# Patient Record
Sex: Male | Born: 1935
Health system: Southern US, Community
[De-identification: ages and names within clinical notes are randomized; demographics above are authoritative.]

## PROBLEM LIST (undated history)

## (undated) DIAGNOSIS — I4891 Unspecified atrial fibrillation: Secondary | ICD-10-CM

## (undated) DIAGNOSIS — K219 Gastro-esophageal reflux disease without esophagitis: Secondary | ICD-10-CM

## (undated) DIAGNOSIS — I10 Essential (primary) hypertension: Secondary | ICD-10-CM

## (undated) DIAGNOSIS — J45909 Unspecified asthma, uncomplicated: Secondary | ICD-10-CM

## (undated) DIAGNOSIS — N289 Disorder of kidney and ureter, unspecified: Secondary | ICD-10-CM

## (undated) DIAGNOSIS — Z961 Presence of intraocular lens: Secondary | ICD-10-CM

## (undated) DIAGNOSIS — C801 Malignant (primary) neoplasm, unspecified: Secondary | ICD-10-CM

## (undated) DIAGNOSIS — N400 Enlarged prostate without lower urinary tract symptoms: Secondary | ICD-10-CM

## (undated) DIAGNOSIS — M549 Dorsalgia, unspecified: Secondary | ICD-10-CM

## (undated) DIAGNOSIS — G629 Polyneuropathy, unspecified: Secondary | ICD-10-CM

## (undated) DIAGNOSIS — L570 Actinic keratosis: Secondary | ICD-10-CM

## (undated) DIAGNOSIS — R911 Solitary pulmonary nodule: Secondary | ICD-10-CM

## (undated) DIAGNOSIS — M542 Cervicalgia: Secondary | ICD-10-CM

## (undated) DIAGNOSIS — I482 Chronic atrial fibrillation, unspecified: Secondary | ICD-10-CM

## (undated) DIAGNOSIS — M1990 Unspecified osteoarthritis, unspecified site: Secondary | ICD-10-CM

## (undated) HISTORY — PX: BAND HEMORRHOIDECTOMY: SHX1213

## (undated) HISTORY — PX: CARDIAC CATHETERIZATION: SHX172

## (undated) HISTORY — PX: HEMORRHOID SURGERY: SHX153

---

## 2008-08-03 ENCOUNTER — Encounter: Admission: RE | Admit: 2008-08-03 | Discharge: 2008-08-03 | Payer: Self-pay | Admitting: Otolaryngology

## 2010-02-18 ENCOUNTER — Encounter: Payer: Self-pay | Admitting: Otolaryngology

## 2014-07-14 ENCOUNTER — Emergency Department (HOSPITAL_BASED_OUTPATIENT_CLINIC_OR_DEPARTMENT_OTHER)
Admission: EM | Admit: 2014-07-14 | Discharge: 2014-07-14 | Disposition: A | Discharge status: Home / Self Care | Payer: Medicare Other | Attending: Emergency Medicine | Admitting: Emergency Medicine

## 2014-07-14 ENCOUNTER — Encounter (HOSPITAL_BASED_OUTPATIENT_CLINIC_OR_DEPARTMENT_OTHER): Payer: Self-pay | Admitting: *Deleted

## 2014-07-14 DIAGNOSIS — Y998 Other external cause status: Secondary | ICD-10-CM | POA: Diagnosis not present

## 2014-07-14 DIAGNOSIS — Y9389 Activity, other specified: Secondary | ICD-10-CM | POA: Diagnosis not present

## 2014-07-14 DIAGNOSIS — I4891 Unspecified atrial fibrillation: Secondary | ICD-10-CM | POA: Diagnosis not present

## 2014-07-14 DIAGNOSIS — S30860A Insect bite (nonvenomous) of lower back and pelvis, initial encounter: Secondary | ICD-10-CM | POA: Insufficient documentation

## 2014-07-14 DIAGNOSIS — Y9289 Other specified places as the place of occurrence of the external cause: Secondary | ICD-10-CM | POA: Diagnosis not present

## 2014-07-14 DIAGNOSIS — Z23 Encounter for immunization: Secondary | ICD-10-CM | POA: Diagnosis not present

## 2014-07-14 DIAGNOSIS — L259 Unspecified contact dermatitis, unspecified cause: Secondary | ICD-10-CM | POA: Insufficient documentation

## 2014-07-14 DIAGNOSIS — W57XXXA Bitten or stung by nonvenomous insect and other nonvenomous arthropods, initial encounter: Secondary | ICD-10-CM | POA: Insufficient documentation

## 2014-07-14 HISTORY — DX: Unspecified atrial fibrillation: I48.91

## 2014-07-14 MED ORDER — TETANUS-DIPHTH-ACELL PERTUSSIS 5-2.5-18.5 LF-MCG/0.5 IM SUSP
0.5000 mL | Freq: Once | INTRAMUSCULAR | Status: AC
  Administered 2014-07-14: 0.5 mL via INTRAMUSCULAR
  Filled 2014-07-14: qty 0.5

## 2014-07-14 NOTE — ED Notes (Signed)
He removed a tick 5 days ago. He has been on Doxycycline x 2 days. Site is itching and remains red.

## 2014-07-14 NOTE — Discharge Instructions (Signed)
Tick Bite Information Ticks are insects that attach themselves to the skin and draw blood for food. There are various types of ticks. Common types include wood ticks and deer ticks. Most ticks live in shrubs and grassy areas. Ticks can climb onto your body when you make contact with leaves or grass where the tick is waiting. The most common places on the body for ticks to attach themselves are the scalp, neck, armpits, waist, and groin. Most tick bites are harmless, but sometimes ticks carry germs that cause diseases. These germs can be spread to a person during the tick's feeding process. The chance of a disease spreading through a tick bite depends on:   The type of tick.  Time of year.   How long the tick is attached.   Geographic location.  HOW CAN YOU PREVENT TICK BITES? Take these steps to help prevent tick bites when you are outdoors:  Wear protective clothing. Long sleeves and long pants are best.   Wear white clothes so you can see ticks more easily.  Tuck your pant legs into your socks.   If walking on a trail, stay in the middle of the trail to avoid brushing against bushes.  Avoid walking through areas with long grass.  Put insect repellent on all exposed skin and along boot tops, pant legs, and sleeve cuffs.   Check clothing, hair, and skin repeatedly and before going inside.   Brush off any ticks that are not attached.  Take a shower or bath as soon as possible after being outdoors.  WHAT IS THE PROPER WAY TO REMOVE A TICK? Ticks should be removed as soon as possible to help prevent diseases caused by tick bites. 1. If latex gloves are available, put them on before trying to remove a tick.  2. Using fine-point tweezers, grasp the tick as close to the skin as possible. You may also use curved forceps or a tick removal tool. Grasp the tick as close to its head as possible. Avoid grasping the tick on its body. 3. Pull gently with steady upward pressure until  the tick lets go. Do not twist the tick or jerk it suddenly. This may break off the tick's head or mouth parts. 4. Do not squeeze or crush the tick's body. This could force disease-carrying fluids from the tick into your body.  5. After the tick is removed, wash the bite area and your hands with soap and water or other disinfectant such as alcohol. 6. Apply a small amount of antiseptic cream or ointment to the bite site.  7. Wash and disinfect any instruments that were used.  Do not try to remove a tick by applying a hot match, petroleum jelly, or fingernail polish to the tick. These methods do not work and may increase the chances of disease being spread from the tick bite.  WHEN SHOULD YOU SEEK MEDICAL CARE? Contact your health care provider if you are unable to remove a tick from your skin or if a part of the tick breaks off and is stuck in the skin.  After a tick bite, you need to be aware of signs and symptoms that could be related to diseases spread by ticks. Contact your health care provider if you develop any of the following in the days or weeks after the tick bite:  Unexplained fever.  Rash. A circular rash that appears days or weeks after the tick bite may indicate the possibility of Lyme disease. The rash may resemble   a target with a bull's-eye and may occur at a different part of your body than the tick bite.  Redness and swelling in the area of the tick bite.   Tender, swollen lymph glands.   Diarrhea.   Weight loss.   Cough.   Fatigue.   Muscle, joint, or bone pain.   Abdominal pain.   Headache.   Lethargy or a change in your level of consciousness.  Difficulty walking or moving your legs.   Numbness in the legs.   Paralysis.  Shortness of breath.   Confusion.   Repeated vomiting.  Document Released: 01/11/2000 Document Revised: 11/03/2012 Document Reviewed: 06/23/2012 ExitCare Patient Information 2015 [employer]. This information is  not intended to replace advice given to you by your health care provider. Make sure you discuss any questions you have with your health care provider.  

## 2014-07-14 NOTE — ED Provider Notes (Signed)
CSN: [id number]     Arrival date & time 07/14/14  1651 History   First MD Initiated Contact with Patient 07/14/14 1717     Cc: Persistent rash from a tick bite HPI The patient removed a tick 5 days ago. He noticed a red rash surrounding the bite and spoke to his doctor. He was started on doxycycline 2 days ago. Patient has taken 4 doses. Since that time the pain and discomfort associated with the bite has improved. However, he has continued to notice an area of redness around the wound. The patient has been placing a Band-Aid over the bite to help prevent any irritation. The rash is somewhat itchy. He denies any fever. He denies any trouble with joint aches, myalgias or fatigue. No vomiting or diarrhea. No other systemic symptoms. Past Medical History  Diagnosis Date  . Atrial fibrillation    Past Surgical History  Procedure Laterality Date  . Band hemorrhoidectomy     No family history on file. History  Substance Use Topics  . Smoking status: Never Smoker   . Smokeless tobacco: Not on file  . Alcohol Use: No    Review of Systems  All other systems reviewed and are negative.     Allergies  Review of patient's allergies indicates no known allergies.  Home Medications   Prior to Admission medications   Medication Sig Start Date End Date Taking? Authorizing Provider  Edoxaban Tosylate (SAVAYSA PO) Take by mouth.   Yes Historical Provider, MD  METOPROLOL TARTRATE PO Take by mouth.   Yes Historical Provider, MD   BP 115/77 mmHg  Pulse 84  Temp(Src) 97.6 F (36.4 C) (Oral)  Resp 18  Ht 5\' 8"  (1.727 m)  Wt 185 lb (83.915 kg)  BMI 28.14 kg/m2  SpO2 97% Physical Exam  Constitutional: He appears well-developed and well-nourished. No distress.  HENT:  Head: Normocephalic and atraumatic.  Right Ear: External ear normal.  Left Ear: External ear normal.  Eyes: Conjunctivae are normal. Right eye exhibits no discharge. Left eye exhibits no discharge. No scleral icterus.  Neck:  Neck supple. No tracheal deviation present.  Cardiovascular: Normal rate.   Pulmonary/Chest: Effort normal. No stridor. No respiratory distress.  Musculoskeletal: He exhibits no edema.  Neurological: He is alert. Cranial nerve deficit: no gross deficits.  Skin: Skin is warm and dry. Rash noted.  Patient has a small area of erythema A 1-2 cm in diameter overlying a central darker spot consistent with the tick bite, rectangular area of mild erythema around that  Psychiatric: He has a normal mood and affect.  Nursing note and vitals reviewed.   ED Course  Procedures (including critical care time) Labs Review Labs Reviewed - No data to display  Imaging Review No results found.   EKG Interpretation None      MDM   Final diagnoses:  Tick bite  Contact dermatitis    The shape of the patient's rash is consistent with a contact type dermatitis from the bandage. Patient is not having any systemic symptoms. He should continue his doxycycline but at this point he does not appear to have systemic symptoms of Lyme's disease or rocking spotted fever. I recommended he discontinue the bandage.  Prn benadryl    Dorie Rank, MD 07/14/14 [id number]

## 2015-02-08 DIAGNOSIS — H43813 Vitreous degeneration, bilateral: Secondary | ICD-10-CM | POA: Diagnosis not present

## 2015-02-08 DIAGNOSIS — H34812 Central retinal vein occlusion, left eye, with macular edema: Secondary | ICD-10-CM | POA: Diagnosis not present

## 2015-02-08 DIAGNOSIS — H35352 Cystoid macular degeneration, left eye: Secondary | ICD-10-CM | POA: Diagnosis not present

## 2015-02-08 DIAGNOSIS — Z961 Presence of intraocular lens: Secondary | ICD-10-CM | POA: Diagnosis not present

## 2015-02-14 DIAGNOSIS — M542 Cervicalgia: Secondary | ICD-10-CM | POA: Diagnosis not present

## 2015-02-19 DIAGNOSIS — I4892 Unspecified atrial flutter: Secondary | ICD-10-CM | POA: Diagnosis not present

## 2015-02-19 DIAGNOSIS — G629 Polyneuropathy, unspecified: Secondary | ICD-10-CM | POA: Diagnosis not present

## 2015-02-19 DIAGNOSIS — I10 Essential (primary) hypertension: Secondary | ICD-10-CM | POA: Diagnosis not present

## 2015-02-19 DIAGNOSIS — I4891 Unspecified atrial fibrillation: Secondary | ICD-10-CM | POA: Diagnosis not present

## 2015-03-13 DIAGNOSIS — M542 Cervicalgia: Secondary | ICD-10-CM | POA: Diagnosis not present

## 2015-03-28 DIAGNOSIS — M542 Cervicalgia: Secondary | ICD-10-CM | POA: Diagnosis not present

## 2015-04-02 DIAGNOSIS — J479 Bronchiectasis, uncomplicated: Secondary | ICD-10-CM | POA: Diagnosis not present

## 2015-04-02 DIAGNOSIS — Z79899 Other long term (current) drug therapy: Secondary | ICD-10-CM | POA: Diagnosis not present

## 2015-04-02 DIAGNOSIS — J3489 Other specified disorders of nose and nasal sinuses: Secondary | ICD-10-CM | POA: Diagnosis not present

## 2015-04-02 DIAGNOSIS — J45909 Unspecified asthma, uncomplicated: Secondary | ICD-10-CM | POA: Diagnosis not present

## 2015-04-02 DIAGNOSIS — R0602 Shortness of breath: Secondary | ICD-10-CM | POA: Diagnosis not present

## 2015-04-02 DIAGNOSIS — Z7901 Long term (current) use of anticoagulants: Secondary | ICD-10-CM | POA: Diagnosis not present

## 2015-04-02 DIAGNOSIS — I482 Chronic atrial fibrillation: Secondary | ICD-10-CM | POA: Diagnosis not present

## 2015-04-02 DIAGNOSIS — G4700 Insomnia, unspecified: Secondary | ICD-10-CM | POA: Diagnosis not present

## 2015-04-02 DIAGNOSIS — R05 Cough: Secondary | ICD-10-CM | POA: Diagnosis not present

## 2015-04-02 DIAGNOSIS — R0609 Other forms of dyspnea: Secondary | ICD-10-CM | POA: Diagnosis not present

## 2015-04-05 DIAGNOSIS — H34812 Central retinal vein occlusion, left eye, with macular edema: Secondary | ICD-10-CM | POA: Diagnosis not present

## 2015-04-26 DIAGNOSIS — G629 Polyneuropathy, unspecified: Secondary | ICD-10-CM | POA: Diagnosis not present

## 2015-05-07 DIAGNOSIS — H34812 Central retinal vein occlusion, left eye, with macular edema: Secondary | ICD-10-CM | POA: Diagnosis not present

## 2015-05-07 DIAGNOSIS — H353112 Nonexudative age-related macular degeneration, right eye, intermediate dry stage: Secondary | ICD-10-CM | POA: Diagnosis not present

## 2015-05-07 DIAGNOSIS — Z79899 Other long term (current) drug therapy: Secondary | ICD-10-CM | POA: Diagnosis not present

## 2015-05-07 DIAGNOSIS — Z961 Presence of intraocular lens: Secondary | ICD-10-CM | POA: Diagnosis not present

## 2015-05-07 DIAGNOSIS — H43812 Vitreous degeneration, left eye: Secondary | ICD-10-CM | POA: Diagnosis not present

## 2015-05-17 DIAGNOSIS — N200 Calculus of kidney: Secondary | ICD-10-CM | POA: Diagnosis not present

## 2015-05-17 DIAGNOSIS — N401 Enlarged prostate with lower urinary tract symptoms: Secondary | ICD-10-CM | POA: Diagnosis not present

## 2015-05-24 DIAGNOSIS — H52203 Unspecified astigmatism, bilateral: Secondary | ICD-10-CM | POA: Diagnosis not present

## 2015-05-24 DIAGNOSIS — H5203 Hypermetropia, bilateral: Secondary | ICD-10-CM | POA: Diagnosis not present

## 2015-05-28 DIAGNOSIS — I4891 Unspecified atrial fibrillation: Secondary | ICD-10-CM | POA: Diagnosis not present

## 2015-05-28 DIAGNOSIS — I509 Heart failure, unspecified: Secondary | ICD-10-CM | POA: Diagnosis not present

## 2015-05-28 DIAGNOSIS — I4892 Unspecified atrial flutter: Secondary | ICD-10-CM | POA: Diagnosis not present

## 2015-06-19 DIAGNOSIS — Z79899 Other long term (current) drug therapy: Secondary | ICD-10-CM | POA: Diagnosis not present

## 2015-06-19 DIAGNOSIS — Z7901 Long term (current) use of anticoagulants: Secondary | ICD-10-CM | POA: Diagnosis not present

## 2015-06-19 DIAGNOSIS — H34812 Central retinal vein occlusion, left eye, with macular edema: Secondary | ICD-10-CM | POA: Diagnosis not present

## 2015-06-21 DIAGNOSIS — C4441 Basal cell carcinoma of skin of scalp and neck: Secondary | ICD-10-CM | POA: Diagnosis not present

## 2015-06-21 DIAGNOSIS — L570 Actinic keratosis: Secondary | ICD-10-CM | POA: Diagnosis not present

## 2015-06-21 DIAGNOSIS — L821 Other seborrheic keratosis: Secondary | ICD-10-CM | POA: Diagnosis not present

## 2015-06-21 DIAGNOSIS — D225 Melanocytic nevi of trunk: Secondary | ICD-10-CM | POA: Diagnosis not present

## 2015-06-21 DIAGNOSIS — C44329 Squamous cell carcinoma of skin of other parts of face: Secondary | ICD-10-CM | POA: Diagnosis not present

## 2015-06-21 DIAGNOSIS — D1801 Hemangioma of skin and subcutaneous tissue: Secondary | ICD-10-CM | POA: Diagnosis not present

## 2015-06-21 DIAGNOSIS — Z85828 Personal history of other malignant neoplasm of skin: Secondary | ICD-10-CM | POA: Diagnosis not present

## 2015-06-21 DIAGNOSIS — L812 Freckles: Secondary | ICD-10-CM | POA: Diagnosis not present

## 2015-06-21 DIAGNOSIS — D485 Neoplasm of uncertain behavior of skin: Secondary | ICD-10-CM | POA: Diagnosis not present

## 2015-06-21 DIAGNOSIS — C44619 Basal cell carcinoma of skin of left upper limb, including shoulder: Secondary | ICD-10-CM | POA: Diagnosis not present

## 2015-07-04 DIAGNOSIS — C4441 Basal cell carcinoma of skin of scalp and neck: Secondary | ICD-10-CM | POA: Diagnosis not present

## 2015-07-04 DIAGNOSIS — C44619 Basal cell carcinoma of skin of left upper limb, including shoulder: Secondary | ICD-10-CM | POA: Diagnosis not present

## 2015-07-25 DIAGNOSIS — C44329 Squamous cell carcinoma of skin of other parts of face: Secondary | ICD-10-CM | POA: Diagnosis not present

## 2015-08-03 DIAGNOSIS — Z79899 Other long term (current) drug therapy: Secondary | ICD-10-CM | POA: Diagnosis not present

## 2015-08-03 DIAGNOSIS — H34812 Central retinal vein occlusion, left eye, with macular edema: Secondary | ICD-10-CM | POA: Diagnosis not present

## 2015-09-07 DIAGNOSIS — W57XXXA Bitten or stung by nonvenomous insect and other nonvenomous arthropods, initial encounter: Secondary | ICD-10-CM | POA: Diagnosis not present

## 2015-09-07 DIAGNOSIS — C4492 Squamous cell carcinoma of skin, unspecified: Secondary | ICD-10-CM | POA: Diagnosis not present

## 2015-09-07 DIAGNOSIS — R21 Rash and other nonspecific skin eruption: Secondary | ICD-10-CM | POA: Diagnosis not present

## 2015-09-11 DIAGNOSIS — W57XXXS Bitten or stung by nonvenomous insect and other nonvenomous arthropods, sequela: Secondary | ICD-10-CM | POA: Diagnosis not present

## 2015-09-11 DIAGNOSIS — R21 Rash and other nonspecific skin eruption: Secondary | ICD-10-CM | POA: Diagnosis not present

## 2015-09-13 DIAGNOSIS — I10 Essential (primary) hypertension: Secondary | ICD-10-CM | POA: Diagnosis not present

## 2015-09-13 DIAGNOSIS — R21 Rash and other nonspecific skin eruption: Secondary | ICD-10-CM | POA: Diagnosis not present

## 2015-09-13 DIAGNOSIS — S80862A Insect bite (nonvenomous), left lower leg, initial encounter: Secondary | ICD-10-CM | POA: Diagnosis not present

## 2015-09-13 DIAGNOSIS — Z885 Allergy status to narcotic agent status: Secondary | ICD-10-CM | POA: Diagnosis not present

## 2015-09-13 DIAGNOSIS — Z888 Allergy status to other drugs, medicaments and biological substances status: Secondary | ICD-10-CM | POA: Diagnosis not present

## 2015-09-13 DIAGNOSIS — W57XXXA Bitten or stung by nonvenomous insect and other nonvenomous arthropods, initial encounter: Secondary | ICD-10-CM | POA: Diagnosis not present

## 2015-09-13 DIAGNOSIS — I4891 Unspecified atrial fibrillation: Secondary | ICD-10-CM | POA: Diagnosis not present

## 2015-09-13 DIAGNOSIS — J45909 Unspecified asthma, uncomplicated: Secondary | ICD-10-CM | POA: Diagnosis not present

## 2015-09-13 DIAGNOSIS — Z79899 Other long term (current) drug therapy: Secondary | ICD-10-CM | POA: Diagnosis not present

## 2015-09-14 DIAGNOSIS — H43812 Vitreous degeneration, left eye: Secondary | ICD-10-CM | POA: Diagnosis not present

## 2015-09-14 DIAGNOSIS — H59032 Cystoid macular edema following cataract surgery, left eye: Secondary | ICD-10-CM | POA: Diagnosis not present

## 2015-09-14 DIAGNOSIS — Z961 Presence of intraocular lens: Secondary | ICD-10-CM | POA: Diagnosis not present

## 2015-09-14 DIAGNOSIS — H353112 Nonexudative age-related macular degeneration, right eye, intermediate dry stage: Secondary | ICD-10-CM | POA: Diagnosis not present

## 2015-09-14 DIAGNOSIS — H3582 Retinal ischemia: Secondary | ICD-10-CM | POA: Diagnosis not present

## 2015-09-14 DIAGNOSIS — H34812 Central retinal vein occlusion, left eye, with macular edema: Secondary | ICD-10-CM | POA: Diagnosis not present

## 2015-09-18 DIAGNOSIS — Z79899 Other long term (current) drug therapy: Secondary | ICD-10-CM | POA: Diagnosis not present

## 2015-09-18 DIAGNOSIS — W57XXXA Bitten or stung by nonvenomous insect and other nonvenomous arthropods, initial encounter: Secondary | ICD-10-CM | POA: Diagnosis not present

## 2015-09-18 DIAGNOSIS — J449 Chronic obstructive pulmonary disease, unspecified: Secondary | ICD-10-CM | POA: Diagnosis not present

## 2015-09-18 DIAGNOSIS — G4700 Insomnia, unspecified: Secondary | ICD-10-CM | POA: Diagnosis not present

## 2015-09-18 DIAGNOSIS — G4730 Sleep apnea, unspecified: Secondary | ICD-10-CM | POA: Diagnosis not present

## 2015-09-18 DIAGNOSIS — Z85828 Personal history of other malignant neoplasm of skin: Secondary | ICD-10-CM | POA: Diagnosis not present

## 2015-09-18 DIAGNOSIS — R7302 Impaired glucose tolerance (oral): Secondary | ICD-10-CM | POA: Diagnosis not present

## 2015-09-18 DIAGNOSIS — G629 Polyneuropathy, unspecified: Secondary | ICD-10-CM | POA: Diagnosis not present

## 2015-09-18 DIAGNOSIS — K219 Gastro-esophageal reflux disease without esophagitis: Secondary | ICD-10-CM | POA: Diagnosis not present

## 2015-09-18 DIAGNOSIS — R5383 Other fatigue: Secondary | ICD-10-CM | POA: Diagnosis not present

## 2015-09-18 DIAGNOSIS — I4891 Unspecified atrial fibrillation: Secondary | ICD-10-CM | POA: Diagnosis not present

## 2015-09-18 DIAGNOSIS — L988 Other specified disorders of the skin and subcutaneous tissue: Secondary | ICD-10-CM | POA: Diagnosis not present

## 2015-09-18 DIAGNOSIS — Z8719 Personal history of other diseases of the digestive system: Secondary | ICD-10-CM | POA: Diagnosis not present

## 2015-09-18 DIAGNOSIS — N400 Enlarged prostate without lower urinary tract symptoms: Secondary | ICD-10-CM | POA: Diagnosis not present

## 2015-09-18 DIAGNOSIS — R531 Weakness: Secondary | ICD-10-CM | POA: Diagnosis not present

## 2015-09-24 DIAGNOSIS — R21 Rash and other nonspecific skin eruption: Secondary | ICD-10-CM | POA: Diagnosis not present

## 2015-09-24 DIAGNOSIS — W57XXXS Bitten or stung by nonvenomous insect and other nonvenomous arthropods, sequela: Secondary | ICD-10-CM | POA: Diagnosis not present

## 2015-10-05 DIAGNOSIS — M170 Bilateral primary osteoarthritis of knee: Secondary | ICD-10-CM | POA: Diagnosis not present

## 2015-10-05 DIAGNOSIS — M1712 Unilateral primary osteoarthritis, left knee: Secondary | ICD-10-CM | POA: Diagnosis not present

## 2015-10-05 DIAGNOSIS — M7062 Trochanteric bursitis, left hip: Secondary | ICD-10-CM | POA: Diagnosis not present

## 2015-10-08 DIAGNOSIS — G4733 Obstructive sleep apnea (adult) (pediatric): Secondary | ICD-10-CM | POA: Diagnosis not present

## 2015-10-08 DIAGNOSIS — Z7901 Long term (current) use of anticoagulants: Secondary | ICD-10-CM | POA: Diagnosis not present

## 2015-10-08 DIAGNOSIS — R0602 Shortness of breath: Secondary | ICD-10-CM | POA: Diagnosis not present

## 2015-10-08 DIAGNOSIS — I482 Chronic atrial fibrillation: Secondary | ICD-10-CM | POA: Diagnosis not present

## 2015-10-26 DIAGNOSIS — M1712 Unilateral primary osteoarthritis, left knee: Secondary | ICD-10-CM | POA: Diagnosis not present

## 2015-10-26 DIAGNOSIS — M7652 Patellar tendinitis, left knee: Secondary | ICD-10-CM | POA: Diagnosis not present

## 2015-11-02 DIAGNOSIS — H34812 Central retinal vein occlusion, left eye, with macular edema: Secondary | ICD-10-CM | POA: Diagnosis not present

## 2015-11-07 DIAGNOSIS — G4731 Primary central sleep apnea: Secondary | ICD-10-CM | POA: Diagnosis not present

## 2015-11-15 DIAGNOSIS — N402 Nodular prostate without lower urinary tract symptoms: Secondary | ICD-10-CM | POA: Diagnosis not present

## 2015-11-15 DIAGNOSIS — M47812 Spondylosis without myelopathy or radiculopathy, cervical region: Secondary | ICD-10-CM | POA: Diagnosis not present

## 2015-11-15 DIAGNOSIS — J470 Bronchiectasis with acute lower respiratory infection: Secondary | ICD-10-CM | POA: Diagnosis not present

## 2015-11-15 DIAGNOSIS — M47892 Other spondylosis, cervical region: Secondary | ICD-10-CM | POA: Diagnosis not present

## 2015-11-15 DIAGNOSIS — Z23 Encounter for immunization: Secondary | ICD-10-CM | POA: Diagnosis not present

## 2015-11-15 DIAGNOSIS — M7072 Other bursitis of hip, left hip: Secondary | ICD-10-CM | POA: Diagnosis not present

## 2015-11-15 DIAGNOSIS — G629 Polyneuropathy, unspecified: Secondary | ICD-10-CM | POA: Diagnosis not present

## 2015-11-15 DIAGNOSIS — I482 Chronic atrial fibrillation: Secondary | ICD-10-CM | POA: Diagnosis not present

## 2015-11-15 DIAGNOSIS — M549 Dorsalgia, unspecified: Secondary | ICD-10-CM | POA: Diagnosis not present

## 2015-11-15 DIAGNOSIS — G4700 Insomnia, unspecified: Secondary | ICD-10-CM | POA: Diagnosis not present

## 2015-11-15 DIAGNOSIS — Z885 Allergy status to narcotic agent status: Secondary | ICD-10-CM | POA: Diagnosis not present

## 2015-11-15 DIAGNOSIS — E7439 Other disorders of intestinal carbohydrate absorption: Secondary | ICD-10-CM | POA: Diagnosis not present

## 2015-11-15 DIAGNOSIS — I10 Essential (primary) hypertension: Secondary | ICD-10-CM | POA: Diagnosis not present

## 2015-11-15 DIAGNOSIS — Z79899 Other long term (current) drug therapy: Secondary | ICD-10-CM | POA: Diagnosis not present

## 2015-11-15 DIAGNOSIS — R7302 Impaired glucose tolerance (oral): Secondary | ICD-10-CM | POA: Diagnosis not present

## 2015-11-15 DIAGNOSIS — Z886 Allergy status to analgesic agent status: Secondary | ICD-10-CM | POA: Diagnosis not present

## 2015-11-15 DIAGNOSIS — Z7901 Long term (current) use of anticoagulants: Secondary | ICD-10-CM | POA: Diagnosis not present

## 2015-11-15 DIAGNOSIS — G8929 Other chronic pain: Secondary | ICD-10-CM | POA: Diagnosis not present

## 2015-11-15 DIAGNOSIS — I700 Atherosclerosis of aorta: Secondary | ICD-10-CM | POA: Diagnosis not present

## 2015-11-15 DIAGNOSIS — Z0000 Encounter for general adult medical examination without abnormal findings: Secondary | ICD-10-CM | POA: Diagnosis not present

## 2015-11-15 DIAGNOSIS — J40 Bronchitis, not specified as acute or chronic: Secondary | ICD-10-CM | POA: Diagnosis not present

## 2015-11-19 DIAGNOSIS — E785 Hyperlipidemia, unspecified: Secondary | ICD-10-CM | POA: Diagnosis not present

## 2015-11-19 DIAGNOSIS — I482 Chronic atrial fibrillation: Secondary | ICD-10-CM | POA: Diagnosis not present

## 2015-11-19 DIAGNOSIS — R0789 Other chest pain: Secondary | ICD-10-CM | POA: Diagnosis not present

## 2015-11-22 DIAGNOSIS — M7062 Trochanteric bursitis, left hip: Secondary | ICD-10-CM | POA: Diagnosis not present

## 2015-11-30 DIAGNOSIS — M7062 Trochanteric bursitis, left hip: Secondary | ICD-10-CM | POA: Diagnosis not present

## 2015-12-10 DIAGNOSIS — H903 Sensorineural hearing loss, bilateral: Secondary | ICD-10-CM | POA: Diagnosis not present

## 2015-12-12 DIAGNOSIS — Q72812 Congenital shortening of left lower limb: Secondary | ICD-10-CM | POA: Diagnosis not present

## 2015-12-12 DIAGNOSIS — M9904 Segmental and somatic dysfunction of sacral region: Secondary | ICD-10-CM | POA: Diagnosis not present

## 2015-12-12 DIAGNOSIS — M5137 Other intervertebral disc degeneration, lumbosacral region: Secondary | ICD-10-CM | POA: Diagnosis not present

## 2015-12-12 DIAGNOSIS — M9905 Segmental and somatic dysfunction of pelvic region: Secondary | ICD-10-CM | POA: Diagnosis not present

## 2015-12-12 DIAGNOSIS — M9903 Segmental and somatic dysfunction of lumbar region: Secondary | ICD-10-CM | POA: Diagnosis not present

## 2015-12-12 DIAGNOSIS — M160 Bilateral primary osteoarthritis of hip: Secondary | ICD-10-CM | POA: Diagnosis not present

## 2015-12-12 DIAGNOSIS — G603 Idiopathic progressive neuropathy: Secondary | ICD-10-CM | POA: Diagnosis not present

## 2015-12-13 DIAGNOSIS — M9903 Segmental and somatic dysfunction of lumbar region: Secondary | ICD-10-CM | POA: Diagnosis not present

## 2015-12-13 DIAGNOSIS — M9904 Segmental and somatic dysfunction of sacral region: Secondary | ICD-10-CM | POA: Diagnosis not present

## 2015-12-13 DIAGNOSIS — M5137 Other intervertebral disc degeneration, lumbosacral region: Secondary | ICD-10-CM | POA: Diagnosis not present

## 2015-12-13 DIAGNOSIS — Q72812 Congenital shortening of left lower limb: Secondary | ICD-10-CM | POA: Diagnosis not present

## 2015-12-13 DIAGNOSIS — M160 Bilateral primary osteoarthritis of hip: Secondary | ICD-10-CM | POA: Diagnosis not present

## 2015-12-13 DIAGNOSIS — M9905 Segmental and somatic dysfunction of pelvic region: Secondary | ICD-10-CM | POA: Diagnosis not present

## 2015-12-13 DIAGNOSIS — G603 Idiopathic progressive neuropathy: Secondary | ICD-10-CM | POA: Diagnosis not present

## 2015-12-26 DIAGNOSIS — D1801 Hemangioma of skin and subcutaneous tissue: Secondary | ICD-10-CM | POA: Diagnosis not present

## 2015-12-26 DIAGNOSIS — L570 Actinic keratosis: Secondary | ICD-10-CM | POA: Diagnosis not present

## 2015-12-26 DIAGNOSIS — L821 Other seborrheic keratosis: Secondary | ICD-10-CM | POA: Diagnosis not present

## 2015-12-26 DIAGNOSIS — Z85828 Personal history of other malignant neoplasm of skin: Secondary | ICD-10-CM | POA: Diagnosis not present

## 2015-12-26 DIAGNOSIS — D485 Neoplasm of uncertain behavior of skin: Secondary | ICD-10-CM | POA: Diagnosis not present

## 2015-12-26 DIAGNOSIS — L814 Other melanin hyperpigmentation: Secondary | ICD-10-CM | POA: Diagnosis not present

## 2015-12-26 DIAGNOSIS — C44329 Squamous cell carcinoma of skin of other parts of face: Secondary | ICD-10-CM | POA: Diagnosis not present

## 2015-12-27 DIAGNOSIS — M7062 Trochanteric bursitis, left hip: Secondary | ICD-10-CM | POA: Diagnosis not present

## 2015-12-27 DIAGNOSIS — M1712 Unilateral primary osteoarthritis, left knee: Secondary | ICD-10-CM | POA: Diagnosis not present

## 2015-12-27 DIAGNOSIS — M1612 Unilateral primary osteoarthritis, left hip: Secondary | ICD-10-CM | POA: Diagnosis not present

## 2015-12-28 DIAGNOSIS — H34812 Central retinal vein occlusion, left eye, with macular edema: Secondary | ICD-10-CM | POA: Diagnosis not present

## 2016-01-02 DIAGNOSIS — M25561 Pain in right knee: Secondary | ICD-10-CM | POA: Diagnosis not present

## 2016-01-03 DIAGNOSIS — M1612 Unilateral primary osteoarthritis, left hip: Secondary | ICD-10-CM | POA: Diagnosis not present

## 2016-02-01 DIAGNOSIS — J0100 Acute maxillary sinusitis, unspecified: Secondary | ICD-10-CM | POA: Diagnosis not present

## 2016-02-01 DIAGNOSIS — R509 Fever, unspecified: Secondary | ICD-10-CM | POA: Diagnosis not present

## 2016-02-07 DIAGNOSIS — J019 Acute sinusitis, unspecified: Secondary | ICD-10-CM | POA: Diagnosis not present

## 2016-02-07 DIAGNOSIS — M1712 Unilateral primary osteoarthritis, left knee: Secondary | ICD-10-CM | POA: Diagnosis not present

## 2016-02-20 DIAGNOSIS — L570 Actinic keratosis: Secondary | ICD-10-CM | POA: Diagnosis not present

## 2016-02-20 DIAGNOSIS — D0439 Carcinoma in situ of skin of other parts of face: Secondary | ICD-10-CM | POA: Diagnosis not present

## 2016-03-04 DIAGNOSIS — R0602 Shortness of breath: Secondary | ICD-10-CM | POA: Diagnosis not present

## 2016-03-17 DIAGNOSIS — Z961 Presence of intraocular lens: Secondary | ICD-10-CM | POA: Diagnosis not present

## 2016-03-17 DIAGNOSIS — H34812 Central retinal vein occlusion, left eye, with macular edema: Secondary | ICD-10-CM | POA: Diagnosis not present

## 2016-03-17 DIAGNOSIS — H353112 Nonexudative age-related macular degeneration, right eye, intermediate dry stage: Secondary | ICD-10-CM | POA: Diagnosis not present

## 2016-03-17 DIAGNOSIS — H43812 Vitreous degeneration, left eye: Secondary | ICD-10-CM | POA: Diagnosis not present

## 2016-03-25 DIAGNOSIS — L570 Actinic keratosis: Secondary | ICD-10-CM | POA: Diagnosis not present

## 2016-04-07 DIAGNOSIS — R0789 Other chest pain: Secondary | ICD-10-CM | POA: Diagnosis not present

## 2016-04-07 DIAGNOSIS — E785 Hyperlipidemia, unspecified: Secondary | ICD-10-CM | POA: Diagnosis not present

## 2016-04-07 DIAGNOSIS — I482 Chronic atrial fibrillation: Secondary | ICD-10-CM | POA: Diagnosis not present

## 2016-04-07 DIAGNOSIS — R0609 Other forms of dyspnea: Secondary | ICD-10-CM | POA: Diagnosis not present

## 2016-04-11 DIAGNOSIS — K219 Gastro-esophageal reflux disease without esophagitis: Secondary | ICD-10-CM | POA: Diagnosis not present

## 2016-04-11 DIAGNOSIS — R490 Dysphonia: Secondary | ICD-10-CM | POA: Diagnosis not present

## 2016-05-15 DIAGNOSIS — N400 Enlarged prostate without lower urinary tract symptoms: Secondary | ICD-10-CM | POA: Diagnosis not present

## 2016-05-15 DIAGNOSIS — N401 Enlarged prostate with lower urinary tract symptoms: Secondary | ICD-10-CM | POA: Diagnosis not present

## 2016-05-19 DIAGNOSIS — Z79899 Other long term (current) drug therapy: Secondary | ICD-10-CM | POA: Diagnosis not present

## 2016-05-19 DIAGNOSIS — G4700 Insomnia, unspecified: Secondary | ICD-10-CM | POA: Diagnosis not present

## 2016-05-19 DIAGNOSIS — M549 Dorsalgia, unspecified: Secondary | ICD-10-CM | POA: Diagnosis not present

## 2016-05-19 DIAGNOSIS — I700 Atherosclerosis of aorta: Secondary | ICD-10-CM | POA: Diagnosis not present

## 2016-05-19 DIAGNOSIS — Z7901 Long term (current) use of anticoagulants: Secondary | ICD-10-CM | POA: Diagnosis not present

## 2016-05-19 DIAGNOSIS — E7439 Other disorders of intestinal carbohydrate absorption: Secondary | ICD-10-CM | POA: Diagnosis not present

## 2016-05-19 DIAGNOSIS — G8929 Other chronic pain: Secondary | ICD-10-CM | POA: Diagnosis not present

## 2016-05-19 DIAGNOSIS — J479 Bronchiectasis, uncomplicated: Secondary | ICD-10-CM | POA: Diagnosis not present

## 2016-05-19 DIAGNOSIS — I482 Chronic atrial fibrillation: Secondary | ICD-10-CM | POA: Diagnosis not present

## 2016-05-19 DIAGNOSIS — N400 Enlarged prostate without lower urinary tract symptoms: Secondary | ICD-10-CM | POA: Diagnosis not present

## 2016-05-19 DIAGNOSIS — M7072 Other bursitis of hip, left hip: Secondary | ICD-10-CM | POA: Diagnosis not present

## 2016-05-19 DIAGNOSIS — M47892 Other spondylosis, cervical region: Secondary | ICD-10-CM | POA: Diagnosis not present

## 2016-05-19 DIAGNOSIS — G629 Polyneuropathy, unspecified: Secondary | ICD-10-CM | POA: Diagnosis not present

## 2016-05-26 DIAGNOSIS — H34812 Central retinal vein occlusion, left eye, with macular edema: Secondary | ICD-10-CM | POA: Diagnosis not present

## 2016-06-13 DIAGNOSIS — M47816 Spondylosis without myelopathy or radiculopathy, lumbar region: Secondary | ICD-10-CM | POA: Diagnosis not present

## 2016-06-13 DIAGNOSIS — M79661 Pain in right lower leg: Secondary | ICD-10-CM | POA: Diagnosis not present

## 2016-06-13 DIAGNOSIS — M79662 Pain in left lower leg: Secondary | ICD-10-CM | POA: Diagnosis not present

## 2016-06-24 DIAGNOSIS — M47816 Spondylosis without myelopathy or radiculopathy, lumbar region: Secondary | ICD-10-CM | POA: Diagnosis not present

## 2016-06-30 DIAGNOSIS — M79662 Pain in left lower leg: Secondary | ICD-10-CM | POA: Diagnosis not present

## 2016-06-30 DIAGNOSIS — M47816 Spondylosis without myelopathy or radiculopathy, lumbar region: Secondary | ICD-10-CM | POA: Diagnosis not present

## 2016-06-30 DIAGNOSIS — M79661 Pain in right lower leg: Secondary | ICD-10-CM | POA: Diagnosis not present

## 2016-07-01 DIAGNOSIS — W57XXXA Bitten or stung by nonvenomous insect and other nonvenomous arthropods, initial encounter: Secondary | ICD-10-CM | POA: Diagnosis not present

## 2016-07-01 DIAGNOSIS — I4891 Unspecified atrial fibrillation: Secondary | ICD-10-CM | POA: Diagnosis not present

## 2016-07-01 DIAGNOSIS — I4892 Unspecified atrial flutter: Secondary | ICD-10-CM | POA: Diagnosis not present

## 2016-07-01 DIAGNOSIS — Z6828 Body mass index (BMI) 28.0-28.9, adult: Secondary | ICD-10-CM | POA: Diagnosis not present

## 2016-07-09 DIAGNOSIS — G609 Hereditary and idiopathic neuropathy, unspecified: Secondary | ICD-10-CM | POA: Diagnosis not present

## 2016-07-09 DIAGNOSIS — M5136 Other intervertebral disc degeneration, lumbar region: Secondary | ICD-10-CM | POA: Diagnosis not present

## 2016-07-16 DIAGNOSIS — L821 Other seborrheic keratosis: Secondary | ICD-10-CM | POA: Diagnosis not present

## 2016-07-16 DIAGNOSIS — Z85828 Personal history of other malignant neoplasm of skin: Secondary | ICD-10-CM | POA: Diagnosis not present

## 2016-07-16 DIAGNOSIS — L814 Other melanin hyperpigmentation: Secondary | ICD-10-CM | POA: Diagnosis not present

## 2016-07-16 DIAGNOSIS — D1801 Hemangioma of skin and subcutaneous tissue: Secondary | ICD-10-CM | POA: Diagnosis not present

## 2016-07-16 DIAGNOSIS — L570 Actinic keratosis: Secondary | ICD-10-CM | POA: Diagnosis not present

## 2016-07-16 DIAGNOSIS — D485 Neoplasm of uncertain behavior of skin: Secondary | ICD-10-CM | POA: Diagnosis not present

## 2016-07-22 DIAGNOSIS — L570 Actinic keratosis: Secondary | ICD-10-CM | POA: Diagnosis not present

## 2016-07-28 DIAGNOSIS — M170 Bilateral primary osteoarthritis of knee: Secondary | ICD-10-CM | POA: Diagnosis not present

## 2016-07-28 DIAGNOSIS — M1712 Unilateral primary osteoarthritis, left knee: Secondary | ICD-10-CM | POA: Diagnosis not present

## 2016-07-28 DIAGNOSIS — M1711 Unilateral primary osteoarthritis, right knee: Secondary | ICD-10-CM | POA: Diagnosis not present

## 2016-07-29 DIAGNOSIS — M5136 Other intervertebral disc degeneration, lumbar region: Secondary | ICD-10-CM | POA: Diagnosis not present

## 2016-07-31 DIAGNOSIS — I4891 Unspecified atrial fibrillation: Secondary | ICD-10-CM | POA: Diagnosis not present

## 2016-08-04 DIAGNOSIS — H43812 Vitreous degeneration, left eye: Secondary | ICD-10-CM | POA: Diagnosis not present

## 2016-08-04 DIAGNOSIS — H353112 Nonexudative age-related macular degeneration, right eye, intermediate dry stage: Secondary | ICD-10-CM | POA: Diagnosis not present

## 2016-08-04 DIAGNOSIS — Z961 Presence of intraocular lens: Secondary | ICD-10-CM | POA: Diagnosis not present

## 2016-08-04 DIAGNOSIS — H34812 Central retinal vein occlusion, left eye, with macular edema: Secondary | ICD-10-CM | POA: Diagnosis not present

## 2016-08-06 DIAGNOSIS — M5432 Sciatica, left side: Secondary | ICD-10-CM | POA: Diagnosis not present

## 2016-08-06 DIAGNOSIS — M5431 Sciatica, right side: Secondary | ICD-10-CM | POA: Diagnosis not present

## 2016-08-06 DIAGNOSIS — M545 Low back pain: Secondary | ICD-10-CM | POA: Diagnosis not present

## 2016-08-06 DIAGNOSIS — M6281 Muscle weakness (generalized): Secondary | ICD-10-CM | POA: Diagnosis not present

## 2016-08-12 DIAGNOSIS — M6281 Muscle weakness (generalized): Secondary | ICD-10-CM | POA: Diagnosis not present

## 2016-08-12 DIAGNOSIS — M545 Low back pain: Secondary | ICD-10-CM | POA: Diagnosis not present

## 2016-08-12 DIAGNOSIS — M5431 Sciatica, right side: Secondary | ICD-10-CM | POA: Diagnosis not present

## 2016-08-12 DIAGNOSIS — M5432 Sciatica, left side: Secondary | ICD-10-CM | POA: Diagnosis not present

## 2016-08-14 DIAGNOSIS — M545 Low back pain: Secondary | ICD-10-CM | POA: Diagnosis not present

## 2016-08-14 DIAGNOSIS — M5431 Sciatica, right side: Secondary | ICD-10-CM | POA: Diagnosis not present

## 2016-08-14 DIAGNOSIS — M6281 Muscle weakness (generalized): Secondary | ICD-10-CM | POA: Diagnosis not present

## 2016-08-14 DIAGNOSIS — M5432 Sciatica, left side: Secondary | ICD-10-CM | POA: Diagnosis not present

## 2016-08-19 DIAGNOSIS — M5432 Sciatica, left side: Secondary | ICD-10-CM | POA: Diagnosis not present

## 2016-08-19 DIAGNOSIS — M6281 Muscle weakness (generalized): Secondary | ICD-10-CM | POA: Diagnosis not present

## 2016-08-19 DIAGNOSIS — M5431 Sciatica, right side: Secondary | ICD-10-CM | POA: Diagnosis not present

## 2016-08-19 DIAGNOSIS — M545 Low back pain: Secondary | ICD-10-CM | POA: Diagnosis not present

## 2016-08-21 DIAGNOSIS — M545 Low back pain: Secondary | ICD-10-CM | POA: Diagnosis not present

## 2016-08-21 DIAGNOSIS — M5432 Sciatica, left side: Secondary | ICD-10-CM | POA: Diagnosis not present

## 2016-08-21 DIAGNOSIS — M5431 Sciatica, right side: Secondary | ICD-10-CM | POA: Diagnosis not present

## 2016-08-21 DIAGNOSIS — M6281 Muscle weakness (generalized): Secondary | ICD-10-CM | POA: Diagnosis not present

## 2016-08-25 ENCOUNTER — Ambulatory Visit (INDEPENDENT_AMBULATORY_CARE_PROVIDER_SITE_OTHER): Payer: Medicare Other | Admitting: Internal Medicine

## 2016-08-25 ENCOUNTER — Encounter: Payer: Self-pay | Admitting: Internal Medicine

## 2016-08-25 VITALS — BP 106/60 | HR 72 | Ht 68.0 in | Wt 193.6 lb

## 2016-08-25 DIAGNOSIS — J479 Bronchiectasis, uncomplicated: Secondary | ICD-10-CM

## 2016-08-25 DIAGNOSIS — R0609 Other forms of dyspnea: Secondary | ICD-10-CM | POA: Diagnosis not present

## 2016-08-25 NOTE — Progress Notes (Signed)
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 Pulmonary Consult    Referred by Dr. Ann Held. Pt states here to est care for Bronchiectasis. He has previously been seen at [facility]. He has a dry cough when he lies down. He gets winded walking up stairs and with exertion such as bringing the trash can back up the driveway.   onset of doe was 6 and really has not worsened since then though more sedentary now  Doe = MMRC1 = can walk nl pace, flat grade, can't hurry or go uphills or steps s sob. Some cough immediately on lying down and does not disturb  Sleep or exac in am's  No obvious day to day or daytime variability or assoc excess/ purulent sputum or mucus plugs or hemoptysis or cp or chest tightness, subjective wheeze or overt sinus or hb symptoms. No unusual exp hx or h/o childhood pna/ asthma or knowledge of premature birth.  Sleeping ok without nocturnal  or early am exacerbation  of respiratory  c/o's or need for noct saba. Also denies any obvious fluctuation of symptoms with weather or environmental changes or other aggravating or alleviating factors except as outlined above   Current Medications, Allergies, Complete Past Medical History, Past Surgical History, Family History, and Social History were reviewed in [employer] record.  ROS  The following are not active complaints unless bolded sore  throat, dysphagia, dental problems, itching, sneezing,  nasal congestion or excess/ purulent secretions, ear ache,   fever, chills, sweats, unintended wt loss, classically pleuritic or exertional cp,  orthopnea pnd or leg swelling, presyncope, palpitations, abdominal pain, anorexia, nausea, vomiting, diarrhea  or change in bowel or bladder habits, change in stools or urine, dysuria,hematuria,  rash, arthralgias, visual complaints, headache, numbness, weakness or ataxia or problems with walking or coordination,  change in mood/affect or memory.                 Review of Systems     Objective:   Physical Exam  very pleasant talkative amb wm with mild voice fatigue   Wt Readings from Last 3 Encounters:  08/25/16 193 lb 9.6 oz (87.8 kg)  07/14/14 185 lb (83.9 kg)    Vital signs reviewed  - Note on arrival 02 sats  98% on RA     HEENT: nl dentition, turbinates bilaterally, and oropharynx. Nl external ear canals without cough reflex   NECK :  without JVD/Nodes/TM/ nl carotid upstrokes bilaterally   LUNGS: no acc muscle use,  Nl contour chest with very coarse bs on inspiration both bases, no wheeze   CV:  IRIR  no s3 or murmur or increase in P2, and no edema   ABD:  soft and nontender with nl inspiratory excursion in the supine position. No bruits or organomegaly  appreciated, bowel sounds nl  MS:  Nl gait/ ext warm without deformities, calf tenderness, cyanosis or clubbing No obvious joint restrictions   SKIN: warm and dry without lesions    NEURO:  alert, approp, nl sensorium with  no motor or cerebellar deficits apparent.     I personally reviewed images and agree with radiology impression as follows:   Chest CT w/o contrast 10/01/2005 No gross bronchiectasis. Questionable minimal cylindrical bronchiectasis within bilateral lung bases     Assessment:

## 2016-08-25 NOTE — Patient Instructions (Addendum)
No change in medications for now but ok to leave off flovent   Please schedule a follow up visit in 3 months but call sooner if needed with PFTs on return  - add needs gerd diet/ avoid fish oil based on assoc bronchiectasis and p review ent note and at onset of any flare: Try prilosec otc 20mg   Take 30-60 min before first meal of the day and Pepcid ac (famotidine) 20 mg one @  bedtime until cough is completely gone for at least a week without the need for cough suppression

## 2016-08-26 ENCOUNTER — Telehealth: Payer: Self-pay | Admitting: *Deleted

## 2016-08-26 ENCOUNTER — Encounter: Payer: Self-pay | Admitting: Internal Medicine

## 2016-08-26 DIAGNOSIS — R0609 Other forms of dyspnea: Secondary | ICD-10-CM | POA: Insufficient documentation

## 2016-08-26 DIAGNOSIS — M6281 Muscle weakness (generalized): Secondary | ICD-10-CM | POA: Diagnosis not present

## 2016-08-26 DIAGNOSIS — M5431 Sciatica, right side: Secondary | ICD-10-CM | POA: Diagnosis not present

## 2016-08-26 DIAGNOSIS — M5432 Sciatica, left side: Secondary | ICD-10-CM | POA: Diagnosis not present

## 2016-08-26 DIAGNOSIS — M545 Low back pain: Secondary | ICD-10-CM | POA: Diagnosis not present

## 2016-08-26 NOTE — Telephone Encounter (Signed)
LMTCB

## 2016-08-26 NOTE — Assessment & Plan Note (Signed)
Spirometry 08/25/2016  FEV1 2.75 (110%)  Ratio 76 s curvature on qvar  - 08/25/2016  Walked RA x 3 laps @ 185 ft each stopped due to  End of study,very fast pace, sats 99% at end with mild sob       Interesting hx of disconnects between his main reported complaint and the pulmonary dx and rx if his hx is to be believed First went in for hoarseness and dx as bronchiectasis, which bronchiectasis doesn't cause Then  Doe and dx with osa, which osa does not cause  Then Doe and dx with asthma with nl pfts, which doesn't explain why he is sob if pfts are nl while he's sob.   I strongly suspect communication issues here and found it very challenging to pin him down on any specific problem/ symptom today that we could agree was severe enough to warrant intervention but offered to f/u in 3 months with full pft and try off qvar which may just be irritating his upper airway symptoms  If hoarseness or cough worsen off qvar I would strongly favor more aggressive rx for gerd (as per ENTs rec) than add back pulmonary rx without regrouping here first   Total time devoted to counseling  > 50 % of initial 60 min office visit:  review case with pt/ discussion of options/alternatives/ personally creating written customized instructions  in presence of pt  then going over those specific  Instructions directly with the pt including how to use all of the meds but in particular covering each new medication in detail and the difference between the maintenance= "automatic" meds and the prns using an action plan format for the latter (If this problem/symptom => do that organization reading Left to right).  Please see AVS from this visit for a full list of these instructions which I personally wrote for this pt and  are unique to this visit.

## 2016-08-26 NOTE — Telephone Encounter (Signed)
Pt is aware of MW's recommendations and voiced his understanding. GERD diet has been mailed to address on file- verified with pt  Nothing further needed.

## 2016-08-26 NOTE — Telephone Encounter (Signed)
Pt returning call.Jonathan Gordon ° °

## 2016-08-26 NOTE — Telephone Encounter (Signed)
-----   Message from Tanda Rockers, MD sent at 08/26/2016  5:52 AM EDT ----- - add needs gerd diet/ avoid fish oil based on assoc bronchiectasis and p review ent note and at onset of any flare: Try prilosec otc 20mg   Take 30-60 min before first meal of the day and Pepcid ac (famotidine) 20 mg one @  bedtime until cough is completely gone for at least a week without the need for cough suppression

## 2016-08-26 NOTE — Assessment & Plan Note (Signed)
CT  10/01/2005 ? Mild Bilateral lower lobe bronchiectasis  In absence of purulent sputum or airflow obst there is no indication to treat this but rather prevent worsening:  eg keep up with immunizations, early abx for flare, and rx GERD if active (discouraged fish oil in this setting)

## 2016-08-28 DIAGNOSIS — M6281 Muscle weakness (generalized): Secondary | ICD-10-CM | POA: Diagnosis not present

## 2016-08-28 DIAGNOSIS — M545 Low back pain: Secondary | ICD-10-CM | POA: Diagnosis not present

## 2016-08-28 DIAGNOSIS — M5432 Sciatica, left side: Secondary | ICD-10-CM | POA: Diagnosis not present

## 2016-08-28 DIAGNOSIS — M5431 Sciatica, right side: Secondary | ICD-10-CM | POA: Diagnosis not present

## 2016-09-18 DIAGNOSIS — M1712 Unilateral primary osteoarthritis, left knee: Secondary | ICD-10-CM | POA: Diagnosis not present

## 2016-09-18 DIAGNOSIS — M1711 Unilateral primary osteoarthritis, right knee: Secondary | ICD-10-CM | POA: Diagnosis not present

## 2016-10-23 DIAGNOSIS — M47816 Spondylosis without myelopathy or radiculopathy, lumbar region: Secondary | ICD-10-CM | POA: Diagnosis not present

## 2016-10-23 DIAGNOSIS — M79662 Pain in left lower leg: Secondary | ICD-10-CM | POA: Diagnosis not present

## 2016-10-28 DIAGNOSIS — M5136 Other intervertebral disc degeneration, lumbar region: Secondary | ICD-10-CM | POA: Diagnosis not present

## 2016-10-28 DIAGNOSIS — M545 Low back pain: Secondary | ICD-10-CM | POA: Diagnosis not present

## 2016-11-10 DIAGNOSIS — H353112 Nonexudative age-related macular degeneration, right eye, intermediate dry stage: Secondary | ICD-10-CM | POA: Diagnosis not present

## 2016-11-10 DIAGNOSIS — H34812 Central retinal vein occlusion, left eye, with macular edema: Secondary | ICD-10-CM | POA: Diagnosis not present

## 2016-11-18 ENCOUNTER — Ambulatory Visit: Payer: Medicare Other | Admitting: Internal Medicine

## 2016-11-26 DIAGNOSIS — M5136 Other intervertebral disc degeneration, lumbar region: Secondary | ICD-10-CM | POA: Diagnosis not present

## 2016-11-26 DIAGNOSIS — M79662 Pain in left lower leg: Secondary | ICD-10-CM | POA: Diagnosis not present

## 2016-11-28 ENCOUNTER — Ambulatory Visit (INDEPENDENT_AMBULATORY_CARE_PROVIDER_SITE_OTHER): Payer: Medicare Other | Admitting: Internal Medicine

## 2016-11-28 ENCOUNTER — Other Ambulatory Visit (INDEPENDENT_AMBULATORY_CARE_PROVIDER_SITE_OTHER): Payer: Medicare Other

## 2016-11-28 ENCOUNTER — Encounter: Payer: Self-pay | Admitting: Internal Medicine

## 2016-11-28 VITALS — BP 112/72 | HR 72 | Ht 69.0 in | Wt 194.0 lb

## 2016-11-28 DIAGNOSIS — R05 Cough: Secondary | ICD-10-CM | POA: Diagnosis not present

## 2016-11-28 DIAGNOSIS — R0609 Other forms of dyspnea: Secondary | ICD-10-CM

## 2016-11-28 DIAGNOSIS — J479 Bronchiectasis, uncomplicated: Secondary | ICD-10-CM

## 2016-11-28 DIAGNOSIS — R058 Other specified cough: Secondary | ICD-10-CM

## 2016-11-28 DIAGNOSIS — Z23 Encounter for immunization: Secondary | ICD-10-CM

## 2016-11-28 LAB — PULMONARY FUNCTION TEST
DL/VA % pred: 89 %
DL/VA: 4.02 ml/min/mmHg/L
DLCO UNC % PRED: 87 %
DLCO UNC: 27.09 ml/min/mmHg
DLCO cor % pred: 84 %
DLCO cor: 26.05 ml/min/mmHg
FEF 25-75 PRE: 2.16 L/s
FEF 25-75 Post: 2.94 L/sec
FEF2575-%Change-Post: 36 %
FEF2575-%PRED-PRE: 125 %
FEF2575-%Pred-Post: 171 %
FEV1-%Change-Post: 7 %
FEV1-%PRED-POST: 115 %
FEV1-%Pred-Pre: 107 %
FEV1-POST: 3.02 L
FEV1-PRE: 2.82 L
FEV1FVC-%Change-Post: 3 %
FEV1FVC-%Pred-Pre: 105 %
FEV6-%CHANGE-POST: 3 %
FEV6-%PRED-POST: 112 %
FEV6-%Pred-Pre: 108 %
FEV6-PRE: 3.75 L
FEV6-Post: 3.89 L
FEV6FVC-%PRED-POST: 107 %
FEV6FVC-%PRED-PRE: 107 %
FVC-%CHANGE-POST: 3 %
FVC-%Pred-Post: 104 %
FVC-%Pred-Pre: 100 %
FVC-Post: 3.89 L
FVC-Pre: 3.75 L
POST FEV1/FVC RATIO: 78 %
POST FEV6/FVC RATIO: 100 %
Pre FEV1/FVC ratio: 75 %
Pre FEV6/FVC Ratio: 100 %
RV % pred: 115 %
RV: 3.07 L
TLC % PRED: 98 %
TLC: 6.77 L

## 2016-11-28 LAB — BASIC METABOLIC PANEL
BUN: 16 mg/dL (ref 6–23)
CALCIUM: 8.9 mg/dL (ref 8.4–10.5)
CHLORIDE: 107 meq/L (ref 96–112)
CO2: 27 meq/L (ref 19–32)
Creatinine, Ser: 0.85 mg/dL (ref 0.40–1.50)
GFR: 91.43 mL/min (ref >60.00)
Glucose, Bld: 120 mg/dL — ABNORMAL HIGH (ref 70–99)
POTASSIUM: 3.7 meq/L (ref 3.5–5.1)
SODIUM: 140 meq/L (ref 135–145)

## 2016-11-28 LAB — CBC WITH DIFFERENTIAL/PLATELET
BASOS ABS: 0 10*3/uL (ref 0.0–0.1)
Basophils Relative: 0.9 % (ref 0.0–3.0)
EOS ABS: 0.1 10*3/uL (ref 0.0–0.7)
Eosinophils Relative: 2.4 % (ref 0.0–5.0)
HCT: 43.2 % (ref 39.0–52.0)
Hemoglobin: 14.4 g/dL (ref 13.0–17.0)
LYMPHS ABS: 1.7 10*3/uL (ref 0.7–4.0)
Lymphocytes Relative: 32.6 % (ref 12.0–46.0)
MCHC: 33.4 g/dL (ref 30.0–36.0)
MCV: 90.4 fl (ref 78.0–100.0)
MONO ABS: 0.4 10*3/uL (ref 0.1–1.0)
Monocytes Relative: 7.9 % (ref 3.0–12.0)
NEUTROS PCT: 56.2 % (ref 43.0–77.0)
Neutro Abs: 2.9 10*3/uL (ref 1.4–7.7)
Platelets: 230 10*3/uL (ref 150.0–400.0)
RBC: 4.78 Mil/uL (ref 4.22–5.81)
RDW: 13.2 % (ref 11.5–15.5)
WBC: 5.1 10*3/uL (ref 4.0–10.5)

## 2016-11-28 LAB — TSH: TSH: 2.37 u[IU]/mL (ref 0.35–4.50)

## 2016-11-28 LAB — BRAIN NATRIURETIC PEPTIDE: Pro B Natriuretic peptide (BNP): 143 pg/mL — ABNORMAL HIGH (ref 0.0–100.0)

## 2016-11-28 MED ORDER — AZELASTINE-FLUTICASONE 137-50 MCG/ACT NA SUSP: 1.0000 | Freq: Two times a day (BID) | NASAL | 0 refills | Status: DC

## 2016-11-28 MED ORDER — AZELASTINE-FLUTICASONE 137-50 MCG/ACT NA SUSP: 1.0000 | Freq: Two times a day (BID) | NASAL | 11 refills | Status: DC

## 2016-11-28 NOTE — Patient Instructions (Signed)
PFT done today. 

## 2016-11-28 NOTE — Patient Instructions (Addendum)
Dymista one twice daily each nostril x  one month   Stop all the reflux medications at this point but continue the diet:  GERD (REFLUX)  is an extremely common cause of respiratory symptoms just like yours , many times with no obvious heartburn at all.    It can be treated with medication, but also with lifestyle changes including elevation of the head of your bed (ideally with 6 inch  bed blocks),  Smoking cessation, avoidance of late meals, excessive alcohol, and avoid fatty foods, chocolate, peppermint, colas, red wine, and acidic juices such as orange juice.  NO MINT OR MENTHOL PRODUCTS SO NO COUGH DROPS  USE SUGARLESS CANDY INSTEAD (Jolley ranchers or Stover's or Life Savers) or even ice chips will also do - the key is to swallow to prevent all throat clearing. NO OIL BASED VITAMINS - use powdered substitutes.      Please remember to go to the lab  department downstairs in the basement  for your tests - we will call you with the results when they are available.     Please schedule a follow up office visit in 4 weeks, sooner if needed

## 2016-11-28 NOTE — Progress Notes (Signed)
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 Pulmonary Consult    Referred by Dr. Ann Gordon. Pt states here to est care for Bronchiectasis. He has previously been seen at [facility]. He has a dry cough when he lies down. He gets winded walking up stairs and with exertion such as bringing the trash can back up the driveway.   onset of doe was 89 and really has not worsened since then though more sedentary now  Doe = MMRC1 = can walk nl pace, flat grade, can't hurry or go uphills or steps s sob Some cough immediately on lying down and does not disturb  Sleep or exac in am's  rec No change in medications for now but ok to leave off flovent  Please schedule a follow up visit in 3 months but call sooner if needed with PFTs on return  - add needs gerd diet/ avoid fish oil based on assoc bronchiectasis and p review ent note and at onset of any flare: Try prilosec otc 20mg   Take 30-60 min before first meal of the day and Pepcid ac (famotidine) 20 mg one @  bedtime until cough is completely gone for at least a week without the need for cough suppression     11/28/2016  f/u ov/Jonathan Gordon re: sob with exertion/cough dry worse immediate on lying down  Chief Complaint  Patient presents with  . Follow-up    Pt had PFT prior to visit today. Pt has SOB with exertion.   no change doe reports =  300 ft partially  uphill both ways to mb sob but never has to stop x years (not sure when 1st noted) New complaint = Watery rhinitis all day like a faucet  better at hs  No better on gerd rx     No obvious day to day or daytime variability or assoc excess/ purulent sputum or mucus plugs or hemoptysis or cp or chest tightness, subjective wheeze or overt sinus or hb symptoms. No unusual exp hx or h/o childhood pna/ asthma or knowledge of premature birth.  Sleeping ok flat without nocturnal  or early am exacerbation  of respiratory  c/o's or need for noct saba. Also denies any obvious fluctuation of symptoms with weather or environmental changes or other aggravating or alleviating factors except as outlined above   Current Allergies, Complete Past Medical History, Past Surgical History, Family History, and Social History were reviewed in [employer] record.  ROS  The following are not active complaints unless bolded Hoarseness, sore throat, dysphagia, dental problems, itching, sneezing,  nasal congestion or discharge of excess mucus or purulent secretions, ear ache,   fever, chills, sweats, unintended wt loss or wt gain, classically pleuritic or exertional cp,  orthopnea pnd or  leg swelling, presyncope, palpitations, abdominal pain, anorexia, nausea, vomiting, diarrhea  or change in bowel habits or change in bladder habits, change in stools or change in urine, dysuria, hematuria,  rash, arthralgias, visual complaints, headache, numbness, weakness or ataxia or problems with walking or coordination,  change in mood/affect or memory.        Current Meds  Medication Sig  . diltiazem (DILACOR XR) 180 MG 24 hr capsule Take 180 mg by mouth daily.  Marland Kitchen docusate sodium (COLACE) 100 MG capsule Take 100 mg by mouth daily as needed for mild constipation.  Marland Kitchen edoxaban (SAVAYSA) 60 MG TABS tablet Take 60 mg by mouth daily.  . Ergocalciferol (VITAMIN D2) 2000 units TABS Take 1 tablet by mouth  daily.  . finasteride (PROSCAR) 5 MG tablet Take 5 mg by mouth daily.  Marland Kitchen LORazepam (ATIVAN) 1 MG tablet Take 1 mg by mouth daily as needed for anxiety.  . Magnesium 250 MG TABS Take 1 tablet by mouth daily.  . metoprolol succinate (TOPROL-XL) 25 MG 24 hr tablet Take 25 mg by mouth daily.  . Multiple Vitamins-Minerals (CENTRUM SILVER PO) Take 1 tablet by mouth daily.  . Vitamins-Lipotropics (SUPER B-50 COMPLEX PLUS PO) Take 1 tablet by mouth daily.                Objective:   Physical Exam   very pleasant talkative amb wm with mild voice fatigue    11/29/2016      194  08/25/16 193 lb 9.6 oz (87.8 kg)  07/14/14 185 lb (83.9 kg)    Vital signs reviewed  - Note on arrival 02 sats  97% on RA     HEENT: nl dentition, turbinates bilaterally, and oropharynx. Nl external ear canals without cough reflex   NECK :  without JVD/Nodes/TM/ nl carotid upstrokes bilaterally   LUNGS: no acc muscle use,  Nl contour chest clear to A and P    CV:  IRIR  no s3 or murmur or increase in P2, and no edema   ABD:  soft and nontender with nl inspiratory excursion in the supine position. No bruits or organomegaly appreciated, bowel sounds nl  MS:  Nl gait/ ext warm without deformities, calf tenderness, cyanosis or clubbing No obvious joint restrictions   SKIN: warm and dry without lesions    NEURO:  alert, approp, nl sensorium with  no motor or cerebellar deficits apparent.     I personally reviewed images and agree with radiology impression as follows:   Chest CT w/o contrast 10/01/2005 No gross bronchiectasis. Questionable minimal cylindrical bronchiectasis within bilateral lung bases   Labs ordered/ reviewed:      Chemistry      Component Value Date/Time   NA 140 11/28/2016 1656   K 3.7 11/28/2016 1656   CL 107 11/28/2016 1656   CO2 27 11/28/2016 1656   BUN 16 11/28/2016 1656   CREATININE 0.85 11/28/2016 1656      Component Value Date/Time   CALCIUM 8.9 11/28/2016 1656         Lab Results  Component Value Date   WBC 5.1 11/28/2016   HGB 14.4 11/28/2016   HCT 43.2 11/28/2016   MCV 90.4 11/28/2016   PLT 230.0 11/28/2016        Lab Results  Component Value Date   TSH 2.37 11/28/2016     Lab Results  Component Value Date   PROBNP 143.0 (H) 11/28/2016      Labs ordered 11/28/2016  Allergy profile  Assessment:

## 2016-11-29 ENCOUNTER — Encounter: Payer: Self-pay | Admitting: Internal Medicine

## 2016-11-29 NOTE — Assessment & Plan Note (Signed)
In the absence of a clinical syndrome suggesting bronchiectasis and given nl pft's, no need for active treatment  Can follow this problem prn flare

## 2016-11-29 NOTE — Assessment & Plan Note (Addendum)
Neg resp to gerd rx > d/c 11/28/2016  Trial of dymista  11/28/2016  Allergy profile 11/28/2016 >  Eos 0.1 /  IgE  Pending    If self descibed pnds/ watery rhinitis not responsive to dymista then try atrovent NS next as this would be more suggestive of a pure vasomotor mechanism.

## 2016-11-29 NOTE — Assessment & Plan Note (Addendum)
Spirometry 08/25/2016  FEV1 2.75 (110%)  Ratio 76 s curvature on qvar  - 08/25/2016  Walked RA x 3 laps @ 185 ft each stopped due to  End of study,very fast pace, sats 99% at end with mild sob      - PFT's  11/28/2016  wnl including curvature, dlco s rx prior   Labs all look good today for doe eval except for  minimal elevation in bnp which is non-specific and notes reviewed from cards stating w/u for this problem included LHC last year with nl function and coronaries.  Since chronic symptoms not reproduced in office >>  rec regular submax exercise and if not better then cpst next step > return in one month to schedule.  I had an extended discussion with the patient reviewing all relevant studies completed to date and  lasting 15 to 20 minutes of a 25 minute visit    Each maintenance medication was reviewed in detail including most importantly the difference between maintenance and prns and under what circumstances the prns are to be triggered using an action plan format that is not reflected in the computer generated alphabetically organized AVS.    Please see AVS for specific instructions unique to this visit that I personally wrote and verbalized to the the pt in detail and then reviewed with pt  by my nurse highlighting any  changes in therapy recommended at today's visit to their plan of care.

## 2016-12-01 LAB — RESPIRATORY ALLERGY PROFILE REGION II ~~LOC~~
ALLERGEN, MULBERRY, T70: 0.33 kU/L — AB
ALLERGEN, OAK, T7: 0.42 kU/L — AB
Allergen, A. alternata, m6: <0.1 kU/L
Allergen, Cedar tree, t12: 0.39 kU/L — ABNORMAL HIGH
Allergen, Comm Silver Birch, t9: 0.35 kU/L — ABNORMAL HIGH
Allergen, Cottonwood, t14: 0.47 kU/L — ABNORMAL HIGH
Allergen, Mouse Urine Protein, e78: <0.1 kU/L
Aspergillus fumigatus, m3: <0.1 kU/L
BOX ELDER: 0.48 kU/L — AB
Bermuda Grass: 0.49 kU/L — ABNORMAL HIGH
CLADOSPORIUM HERBARUM (M2) IGE: <0.1 kU/L
CLASS: 0
CLASS: 0
CLASS: 0
CLASS: 0
CLASS: 0
CLASS: 0
CLASS: 1
CLASS: 1
CLASS: 1
CLASS: 1
CLASS: 1
CLASS: 1
CLASS: 1
CLASS: 1
COCKROACH: 0.49 kU/L — AB
COMMON RAGWEED (SHORT) (W1) IGE: 0.44 kU/L — ABNORMAL HIGH
Cat Dander: 0.22 kU/L — ABNORMAL HIGH
Class: 0
Class: 0
Class: 0
Class: 0
Class: 1
Class: 1
Class: 1
Class: 1
Class: 1
Class: 1
Dog Dander: 0.27 kU/L — ABNORMAL HIGH
ELM IGE: 0.46 kU/L — AB
IGE (IMMUNOGLOBULIN E), SERUM: 114 kU/L (ref <=114)
JOHNSON GRASS: 0.46 kU/L — AB
Pecan/Hickory Tree IgE: 0.38 kU/L — ABNORMAL HIGH
Rough Pigweed  IgE: 0.45 kU/L — ABNORMAL HIGH
SHEEP SORREL IGE: 0.5 kU/L — AB
TIMOTHY GRASS: 0.47 kU/L — AB

## 2016-12-01 LAB — INTERPRETATION:

## 2016-12-01 NOTE — Progress Notes (Signed)
Spoke with pt and notified of results per Dr. Wert. Pt verbalized understanding and denied any questions. 

## 2016-12-02 DIAGNOSIS — C44219 Basal cell carcinoma of skin of left ear and external auricular canal: Secondary | ICD-10-CM | POA: Diagnosis not present

## 2016-12-02 DIAGNOSIS — D485 Neoplasm of uncertain behavior of skin: Secondary | ICD-10-CM | POA: Diagnosis not present

## 2016-12-02 DIAGNOSIS — D1801 Hemangioma of skin and subcutaneous tissue: Secondary | ICD-10-CM | POA: Diagnosis not present

## 2016-12-02 DIAGNOSIS — L821 Other seborrheic keratosis: Secondary | ICD-10-CM | POA: Diagnosis not present

## 2016-12-02 DIAGNOSIS — C4441 Basal cell carcinoma of skin of scalp and neck: Secondary | ICD-10-CM | POA: Diagnosis not present

## 2016-12-02 DIAGNOSIS — C4442 Squamous cell carcinoma of skin of scalp and neck: Secondary | ICD-10-CM | POA: Diagnosis not present

## 2016-12-02 DIAGNOSIS — Z85828 Personal history of other malignant neoplasm of skin: Secondary | ICD-10-CM | POA: Diagnosis not present

## 2016-12-02 DIAGNOSIS — L570 Actinic keratosis: Secondary | ICD-10-CM | POA: Diagnosis not present

## 2016-12-11 DIAGNOSIS — M5416 Radiculopathy, lumbar region: Secondary | ICD-10-CM | POA: Diagnosis not present

## 2016-12-17 ENCOUNTER — Telehealth: Payer: Self-pay | Admitting: Internal Medicine

## 2016-12-17 NOTE — Telephone Encounter (Signed)
ATC pt, no answer. Left message for pt to call back.  

## 2016-12-17 NOTE — Telephone Encounter (Signed)
Yes, has to try the generics first anyway and document failure to work as well as dymista in an office note

## 2016-12-17 NOTE — Telephone Encounter (Signed)
Received a fax from the pt's pharmacy. Dymista is not covered by pt's insurance plan.  MW - are you okay with Flonase and Astelin being sent in place of Dymista or do you want [place] to do a PA?

## 2016-12-25 NOTE — Telephone Encounter (Signed)
lmtcb x2 for pt. 

## 2016-12-26 ENCOUNTER — Ambulatory Visit (INDEPENDENT_AMBULATORY_CARE_PROVIDER_SITE_OTHER): Payer: Medicare Other | Admitting: Internal Medicine

## 2016-12-26 ENCOUNTER — Encounter: Payer: Self-pay | Admitting: Internal Medicine

## 2016-12-26 ENCOUNTER — Ambulatory Visit: Payer: Medicare Other | Admitting: Internal Medicine

## 2016-12-26 VITALS — BP 124/78 | HR 88 | Ht 68.0 in | Wt 189.0 lb

## 2016-12-26 DIAGNOSIS — G8929 Other chronic pain: Secondary | ICD-10-CM | POA: Diagnosis not present

## 2016-12-26 DIAGNOSIS — M7062 Trochanteric bursitis, left hip: Secondary | ICD-10-CM | POA: Diagnosis not present

## 2016-12-26 DIAGNOSIS — M25562 Pain in left knee: Secondary | ICD-10-CM | POA: Diagnosis not present

## 2016-12-26 DIAGNOSIS — R05 Cough: Secondary | ICD-10-CM

## 2016-12-26 DIAGNOSIS — Z23 Encounter for immunization: Secondary | ICD-10-CM | POA: Diagnosis not present

## 2016-12-26 DIAGNOSIS — J479 Bronchiectasis, uncomplicated: Secondary | ICD-10-CM | POA: Diagnosis not present

## 2016-12-26 DIAGNOSIS — R0609 Other forms of dyspnea: Secondary | ICD-10-CM

## 2016-12-26 DIAGNOSIS — G4733 Obstructive sleep apnea (adult) (pediatric): Secondary | ICD-10-CM | POA: Diagnosis not present

## 2016-12-26 DIAGNOSIS — Z0000 Encounter for general adult medical examination without abnormal findings: Secondary | ICD-10-CM | POA: Diagnosis not present

## 2016-12-26 DIAGNOSIS — R058 Other specified cough: Secondary | ICD-10-CM

## 2016-12-26 DIAGNOSIS — Z79899 Other long term (current) drug therapy: Secondary | ICD-10-CM | POA: Diagnosis not present

## 2016-12-26 DIAGNOSIS — E782 Mixed hyperlipidemia: Secondary | ICD-10-CM | POA: Diagnosis not present

## 2016-12-26 MED ORDER — AZELASTINE HCL 0.1 % NA SOLN: 1.0000 | Freq: Two times a day (BID) | NASAL | 11 refills | Status: DC

## 2016-12-26 MED ORDER — AZELASTINE-FLUTICASONE 137-50 MCG/ACT NA SUSP: 1.0000 | Freq: Two times a day (BID) | NASAL | 0 refills | Status: DC

## 2016-12-26 MED ORDER — FLUTICASONE PROPIONATE 50 MCG/ACT NA SUSP: 1.0000 | Freq: Two times a day (BID) | NASAL | 11 refills | Status: DC

## 2016-12-26 NOTE — Progress Notes (Signed)
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 Pulmonary Consult    Referred by Dr. Ann Held. Pt states here to est care for Bronchiectasis. He has previously been seen at [facility]. He has a dry cough when he lies down. He gets winded walking up stairs and with exertion such as bringing the trash can back up the driveway.   onset of doe was 27 and really has not worsened since then though more sedentary now  Doe = MMRC1 = can walk nl pace, flat grade, can't hurry or go uphills or steps s sob Some cough immediately on lying down and does not disturb  Sleep or exac in am's  rec No change in medications for now but ok to leave off flovent  Please schedule a follow up visit in 3 months but call sooner if needed with PFTs on return  - add needs gerd diet/ avoid fish oil based on assoc bronchiectasis and p review ent note and at onset of any flare: Try prilosec otc 20mg   Take 30-60 min before first meal of the day and Pepcid ac (famotidine) 20 mg one @  bedtime until cough is completely gone for at least a week without the need for cough suppression     11/28/2016  f/u ov/Jonathan Gordon re: sob with exertion/cough dry worse immediate on lying down  Chief Complaint  Patient presents with  . Follow-up    Pt had PFT prior to visit today. Pt has SOB with exertion.   no change doe reports =  300 ft partially  uphill both ways to mb sob but never has to stop x years (not sure when 1st noted) New complaint = Watery rhinitis all day like a faucet  better at hs  No better on gerd rx  rec Dymista one twice daily each nostril x  one month  Stop all the reflux medications at this point but continue the diet: GERD diet Please remember to go to the lab  department     12/26/2016  f/u ov/Jonathan Gordon re:  Uacs/ unexplained sob no change on vs off symb or GERD rx  Chief Complaint  Patient presents with  . Follow-up    His cough is much improved. His breathing is doing well.   satisfied cough is better to his satisfaction  with dymista/ doe is baseline  Mild pm drowsiness  No am ha  Denies limited much by breathing now x when bends over at the waist or gets up too quickly but then has ok ex tol usually    No obvious day to day or daytime variability or assoc excess/ purulent sputum or mucus plugs or hemoptysis or cp or chest tightness, subjective wheeze or overt sinus or hb symptoms. No unusual exposure hx  or h/o childhood pna/ asthma or knowledge of premature birth.  Sleeping ok flat without nocturnal  or early am exacerbation  of respiratory  c/o's or need for noct saba. Also denies any obvious fluctuation of symptoms with weather or environmental changes or other aggravating or alleviating factors except as outlined above   Current Allergies, Complete Past Medical History, Past Surgical History, Family History, and Social History were reviewed in [employer] record.  ROS  The following are not active complaints unless bolded Hoarseness, sore throat, dysphagia, dental problems, itching, sneezing,  nasal congestion or discharge of excess mucus or purulent secretions, ear ache,   fever, chills, sweats, unintended wt loss or wt gain, classically pleuritic or exertional cp,  orthopnea pnd or leg swelling, presyncope, palpitations, abdominal pain, anorexia, nausea, vomiting,  diarrhea  or change in bowel habits or change in bladder habits, change in stools or change in urine, dysuria, hematuria,  rash, arthralgias, visual complaints, headache, numbness, weakness or ataxia or problems with walking or coordination,  change in mood/affect or memory.        Current Meds verified 12/26/2016   Medication Sig  . Azelastine-Fluticasone (DYMISTA) 137-50 MCG/ACT SUSP Place 1 spray into the nose 2 (two) times daily.  Marland Kitchen diltiazem (DILACOR XR) 180 MG 24 hr capsule Take 180 mg by mouth daily.  Marland Kitchen docusate sodium (COLACE) 100 MG capsule Take 100 mg by mouth daily as needed for mild constipation.  Marland Kitchen edoxaban (SAVAYSA) 60 MG TABS tablet Take 60 mg by mouth daily.  . Ergocalciferol (VITAMIN D2) 2000 units TABS Take 1 tablet by mouth daily.  . finasteride (PROSCAR) 5 MG tablet Take 5 mg by mouth daily.  Marland Kitchen ibuprofen (ADVIL,MOTRIN) 800 MG tablet Take 800 mg by mouth every 8 (eight) hours as needed.  Marland Kitchen LORazepam (ATIVAN) 1 MG tablet Take 1 mg by mouth daily as needed for anxiety.  . Magnesium 250 MG TABS Take 1 tablet by mouth daily.  . metoprolol succinate (TOPROL-XL) 25 MG 24 hr tablet Take 25 mg by mouth daily.  . Multiple Vitamins-Minerals (CENTRUM SILVER PO) Take 1 tablet by mouth daily.  . Vitamins-Lipotropics (SUPER B-50 COMPLEX PLUS PO) Take 1 tablet by mouth daily.                              Objective:   Physical Exam  Pleasant verbose wm nad  12/26/2016     189   11/29/2016      194  08/25/16 193 lb 9.6 oz (87.8 kg)  07/14/14 185 lb (83.9 kg)    Vital signs reviewed - Note on arrival 02 sats  98% on RA        HEENT: nl dentition, turbinates bilaterally, and oropharynx. Nl external ear canals without cough reflex   NECK :  without JVD/Nodes/TM/ nl carotid upstrokes bilaterally   LUNGS: no acc muscle use,  Nl contour chest which is clear to A and P bilaterally without cough on insp or exp maneuvers   CV: IRIR  no s3 or murmur or increase in P2,  and no edema   ABD:  soft and nontender with nl inspiratory excursion in the supine position. No bruits or organomegaly appreciated, bowel sounds nl  MS:  Nl gait/ ext warm without deformities, calf tenderness, cyanosis or clubbing No obvious joint restrictions   SKIN: warm and dry without lesions    NEURO:  alert, approp, nl sensorium with  no motor or cerebellar deficits apparent.               Labs reviewed 12/26/2016      Chemistry      Component Value Date/Time   NA 140 11/28/2016 1656   K 3.7 11/28/2016 1656   CL 107 11/28/2016 1656   CO2 27 11/28/2016 1656   BUN 16 11/28/2016 1656   CREATININE 0.85 11/28/2016 1656      Component Value Date/Time   CALCIUM 8.9 11/28/2016 1656        Lab Results  Component Value Date   WBC 5.1 11/28/2016   HGB 14.4 11/28/2016   HCT 43.2 11/28/2016   MCV 90.4 11/28/2016   PLT 230.0 11/28/2016        Lab Results  Component Value Date   TSH 2.37 11/28/2016     Lab Results  Component Value Date   PROBNP 143.0 (H) 11/28/2016           Assessment:

## 2016-12-26 NOTE — Assessment & Plan Note (Signed)
Advised in the absence of excess daytime drowsiness or refractory hbp or am ha no need to pursue this w/u further as could not tol cpap in past

## 2016-12-26 NOTE — Assessment & Plan Note (Signed)
Spirometry 08/25/2016  FEV1 2.75 (110%)  Ratio 76 s curvature on qvar  - 08/25/2016  Walked RA x 3 laps @ 185 ft each stopped due to  End of study,very fast pace, sats 99% at end with mild sob      - PFT's  11/28/2016  wnl including curvature, dlco s rx prior    Only sob if bends over so likely this is related to body habitus, no further w/u planned

## 2016-12-26 NOTE — Patient Instructions (Addendum)
flonase and astelin one twice daily is the equivalent of dysmista one twice daily if your insurance won't pay for the dysmista     If you are satisfied with your treatment plan,  let your doctor know and he/she can either refill your medications or you can return here when your prescription runs out.     If in any way you are not 100% satisfied,  please tell us.  If 100% better, tell your friends!  Pulmonary follow up is as needed

## 2016-12-26 NOTE — Assessment & Plan Note (Signed)
CT  10/01/2005 ? Mild Bilateral lower lobe bronchiectasis - pfts wnl 11/28/2016 s prior rx  No symptoms to suggest active dz > f/u prn

## 2016-12-26 NOTE — Assessment & Plan Note (Signed)
Neg resp to gerd rx > d/c 11/28/2016 Trial of dymista  11/28/2016 > improved  Allergy profile 11/28/2016 >  Eos 0.1 /  IgE  114 RAST pos cat= dog, grass, trees, ragweed  No evidence of asthma cough variant or otherwise as he has resonded nicely to rx for pnds with dymista and if can't afford it rec trial of generic flonase/ astelin and if they aren't adequate > allergy eval

## 2016-12-29 DIAGNOSIS — M5136 Other intervertebral disc degeneration, lumbar region: Secondary | ICD-10-CM | POA: Diagnosis not present

## 2016-12-29 DIAGNOSIS — H903 Sensorineural hearing loss, bilateral: Secondary | ICD-10-CM | POA: Diagnosis not present

## 2016-12-29 DIAGNOSIS — M79662 Pain in left lower leg: Secondary | ICD-10-CM | POA: Diagnosis not present

## 2016-12-29 DIAGNOSIS — M1612 Unilateral primary osteoarthritis, left hip: Secondary | ICD-10-CM | POA: Diagnosis not present

## 2016-12-31 DIAGNOSIS — C4442 Squamous cell carcinoma of skin of scalp and neck: Secondary | ICD-10-CM | POA: Diagnosis not present

## 2016-12-31 DIAGNOSIS — C44219 Basal cell carcinoma of skin of left ear and external auricular canal: Secondary | ICD-10-CM | POA: Diagnosis not present

## 2017-01-01 DIAGNOSIS — Z1211 Encounter for screening for malignant neoplasm of colon: Secondary | ICD-10-CM | POA: Diagnosis not present

## 2017-01-02 DIAGNOSIS — M79662 Pain in left lower leg: Secondary | ICD-10-CM | POA: Diagnosis not present

## 2017-01-02 DIAGNOSIS — M1612 Unilateral primary osteoarthritis, left hip: Secondary | ICD-10-CM | POA: Diagnosis not present

## 2017-01-02 DIAGNOSIS — M1711 Unilateral primary osteoarthritis, right knee: Secondary | ICD-10-CM | POA: Diagnosis not present

## 2017-01-02 DIAGNOSIS — Z79891 Long term (current) use of opiate analgesic: Secondary | ICD-10-CM | POA: Diagnosis not present

## 2017-01-02 DIAGNOSIS — M5116 Intervertebral disc disorders with radiculopathy, lumbar region: Secondary | ICD-10-CM | POA: Diagnosis not present

## 2017-01-02 DIAGNOSIS — G609 Hereditary and idiopathic neuropathy, unspecified: Secondary | ICD-10-CM | POA: Diagnosis not present

## 2017-01-08 DIAGNOSIS — M1612 Unilateral primary osteoarthritis, left hip: Secondary | ICD-10-CM | POA: Diagnosis not present

## 2017-01-08 DIAGNOSIS — M5116 Intervertebral disc disorders with radiculopathy, lumbar region: Secondary | ICD-10-CM | POA: Diagnosis not present

## 2017-01-08 DIAGNOSIS — M1711 Unilateral primary osteoarthritis, right knee: Secondary | ICD-10-CM | POA: Diagnosis not present

## 2017-01-08 DIAGNOSIS — M5136 Other intervertebral disc degeneration, lumbar region: Secondary | ICD-10-CM | POA: Diagnosis not present

## 2017-01-08 DIAGNOSIS — G609 Hereditary and idiopathic neuropathy, unspecified: Secondary | ICD-10-CM | POA: Diagnosis not present

## 2017-01-08 DIAGNOSIS — M5416 Radiculopathy, lumbar region: Secondary | ICD-10-CM | POA: Diagnosis not present

## 2017-01-08 DIAGNOSIS — M79662 Pain in left lower leg: Secondary | ICD-10-CM | POA: Diagnosis not present

## 2017-01-08 DIAGNOSIS — Z79891 Long term (current) use of opiate analgesic: Secondary | ICD-10-CM | POA: Diagnosis not present

## 2017-01-09 DIAGNOSIS — M5116 Intervertebral disc disorders with radiculopathy, lumbar region: Secondary | ICD-10-CM | POA: Diagnosis not present

## 2017-01-09 DIAGNOSIS — M1612 Unilateral primary osteoarthritis, left hip: Secondary | ICD-10-CM | POA: Diagnosis not present

## 2017-01-09 DIAGNOSIS — M79662 Pain in left lower leg: Secondary | ICD-10-CM | POA: Diagnosis not present

## 2017-01-09 DIAGNOSIS — Z79891 Long term (current) use of opiate analgesic: Secondary | ICD-10-CM | POA: Diagnosis not present

## 2017-01-09 DIAGNOSIS — M1711 Unilateral primary osteoarthritis, right knee: Secondary | ICD-10-CM | POA: Diagnosis not present

## 2017-01-09 DIAGNOSIS — G609 Hereditary and idiopathic neuropathy, unspecified: Secondary | ICD-10-CM | POA: Diagnosis not present

## 2017-01-12 DIAGNOSIS — C44219 Basal cell carcinoma of skin of left ear and external auricular canal: Secondary | ICD-10-CM | POA: Diagnosis not present

## 2017-01-15 DIAGNOSIS — M1711 Unilateral primary osteoarthritis, right knee: Secondary | ICD-10-CM | POA: Diagnosis not present

## 2017-01-15 DIAGNOSIS — M5116 Intervertebral disc disorders with radiculopathy, lumbar region: Secondary | ICD-10-CM | POA: Diagnosis not present

## 2017-01-15 DIAGNOSIS — M1612 Unilateral primary osteoarthritis, left hip: Secondary | ICD-10-CM | POA: Diagnosis not present

## 2017-01-15 DIAGNOSIS — G609 Hereditary and idiopathic neuropathy, unspecified: Secondary | ICD-10-CM | POA: Diagnosis not present

## 2017-01-15 DIAGNOSIS — Z79891 Long term (current) use of opiate analgesic: Secondary | ICD-10-CM | POA: Diagnosis not present

## 2017-01-15 DIAGNOSIS — M79662 Pain in left lower leg: Secondary | ICD-10-CM | POA: Diagnosis not present

## 2017-01-22 DIAGNOSIS — C44212 Basal cell carcinoma of skin of right ear and external auricular canal: Secondary | ICD-10-CM | POA: Diagnosis not present

## 2017-01-22 DIAGNOSIS — Z4802 Encounter for removal of sutures: Secondary | ICD-10-CM | POA: Diagnosis not present

## 2017-01-22 DIAGNOSIS — Z483 Aftercare following surgery for neoplasm: Secondary | ICD-10-CM | POA: Diagnosis not present

## 2017-01-22 DIAGNOSIS — M5442 Lumbago with sciatica, left side: Secondary | ICD-10-CM | POA: Diagnosis not present

## 2017-01-22 DIAGNOSIS — C4442 Squamous cell carcinoma of skin of scalp and neck: Secondary | ICD-10-CM | POA: Diagnosis not present

## 2017-01-23 DIAGNOSIS — M1711 Unilateral primary osteoarthritis, right knee: Secondary | ICD-10-CM | POA: Diagnosis not present

## 2017-01-23 DIAGNOSIS — M1612 Unilateral primary osteoarthritis, left hip: Secondary | ICD-10-CM | POA: Diagnosis not present

## 2017-01-23 DIAGNOSIS — M1712 Unilateral primary osteoarthritis, left knee: Secondary | ICD-10-CM | POA: Diagnosis not present

## 2017-01-23 DIAGNOSIS — G609 Hereditary and idiopathic neuropathy, unspecified: Secondary | ICD-10-CM | POA: Diagnosis not present

## 2017-01-23 DIAGNOSIS — M5116 Intervertebral disc disorders with radiculopathy, lumbar region: Secondary | ICD-10-CM | POA: Diagnosis not present

## 2017-01-23 DIAGNOSIS — M79662 Pain in left lower leg: Secondary | ICD-10-CM | POA: Diagnosis not present

## 2017-01-23 DIAGNOSIS — Z79891 Long term (current) use of opiate analgesic: Secondary | ICD-10-CM | POA: Diagnosis not present

## 2017-01-26 DIAGNOSIS — Z79891 Long term (current) use of opiate analgesic: Secondary | ICD-10-CM | POA: Diagnosis not present

## 2017-01-26 DIAGNOSIS — M1711 Unilateral primary osteoarthritis, right knee: Secondary | ICD-10-CM | POA: Diagnosis not present

## 2017-01-26 DIAGNOSIS — G609 Hereditary and idiopathic neuropathy, unspecified: Secondary | ICD-10-CM | POA: Diagnosis not present

## 2017-01-26 DIAGNOSIS — M5116 Intervertebral disc disorders with radiculopathy, lumbar region: Secondary | ICD-10-CM | POA: Diagnosis not present

## 2017-01-26 DIAGNOSIS — M79662 Pain in left lower leg: Secondary | ICD-10-CM | POA: Diagnosis not present

## 2017-01-26 DIAGNOSIS — M1612 Unilateral primary osteoarthritis, left hip: Secondary | ICD-10-CM | POA: Diagnosis not present

## 2017-01-28 DIAGNOSIS — M5116 Intervertebral disc disorders with radiculopathy, lumbar region: Secondary | ICD-10-CM | POA: Diagnosis not present

## 2017-01-28 DIAGNOSIS — M1711 Unilateral primary osteoarthritis, right knee: Secondary | ICD-10-CM | POA: Diagnosis not present

## 2017-01-28 DIAGNOSIS — G609 Hereditary and idiopathic neuropathy, unspecified: Secondary | ICD-10-CM | POA: Diagnosis not present

## 2017-01-28 DIAGNOSIS — M79662 Pain in left lower leg: Secondary | ICD-10-CM | POA: Diagnosis not present

## 2017-01-28 DIAGNOSIS — M1612 Unilateral primary osteoarthritis, left hip: Secondary | ICD-10-CM | POA: Diagnosis not present

## 2017-01-28 DIAGNOSIS — Z79891 Long term (current) use of opiate analgesic: Secondary | ICD-10-CM | POA: Diagnosis not present

## 2017-01-30 DIAGNOSIS — M1712 Unilateral primary osteoarthritis, left knee: Secondary | ICD-10-CM | POA: Diagnosis not present

## 2017-01-30 DIAGNOSIS — S83242A Other tear of medial meniscus, current injury, left knee, initial encounter: Secondary | ICD-10-CM | POA: Diagnosis not present

## 2017-02-09 DIAGNOSIS — H34812 Central retinal vein occlusion, left eye, with macular edema: Secondary | ICD-10-CM | POA: Diagnosis not present

## 2017-03-16 DIAGNOSIS — M5416 Radiculopathy, lumbar region: Secondary | ICD-10-CM | POA: Diagnosis not present

## 2017-03-16 DIAGNOSIS — G629 Polyneuropathy, unspecified: Secondary | ICD-10-CM | POA: Diagnosis not present

## 2017-03-17 DIAGNOSIS — M948X6 Other specified disorders of cartilage, lower leg: Secondary | ICD-10-CM | POA: Diagnosis not present

## 2017-03-17 DIAGNOSIS — M23232 Derangement of other medial meniscus due to old tear or injury, left knee: Secondary | ICD-10-CM | POA: Diagnosis not present

## 2017-03-17 DIAGNOSIS — G8918 Other acute postprocedural pain: Secondary | ICD-10-CM | POA: Diagnosis not present

## 2017-03-17 DIAGNOSIS — M23332 Other meniscus derangements, other medial meniscus, left knee: Secondary | ICD-10-CM | POA: Diagnosis not present

## 2017-03-27 DIAGNOSIS — M7989 Other specified soft tissue disorders: Secondary | ICD-10-CM | POA: Diagnosis not present

## 2017-03-27 DIAGNOSIS — R609 Edema, unspecified: Secondary | ICD-10-CM | POA: Diagnosis not present

## 2017-03-27 DIAGNOSIS — M79606 Pain in leg, unspecified: Secondary | ICD-10-CM | POA: Diagnosis not present

## 2017-03-27 DIAGNOSIS — M79662 Pain in left lower leg: Secondary | ICD-10-CM | POA: Diagnosis not present

## 2017-04-07 DIAGNOSIS — M5441 Lumbago with sciatica, right side: Secondary | ICD-10-CM | POA: Diagnosis not present

## 2017-04-07 DIAGNOSIS — M5416 Radiculopathy, lumbar region: Secondary | ICD-10-CM | POA: Diagnosis not present

## 2017-04-07 DIAGNOSIS — M5442 Lumbago with sciatica, left side: Secondary | ICD-10-CM | POA: Diagnosis not present

## 2017-04-07 DIAGNOSIS — G8929 Other chronic pain: Secondary | ICD-10-CM | POA: Diagnosis not present

## 2017-04-07 DIAGNOSIS — M5136 Other intervertebral disc degeneration, lumbar region: Secondary | ICD-10-CM | POA: Diagnosis not present

## 2017-04-20 DIAGNOSIS — H34812 Central retinal vein occlusion, left eye, with macular edema: Secondary | ICD-10-CM | POA: Diagnosis not present

## 2017-04-20 DIAGNOSIS — H353112 Nonexudative age-related macular degeneration, right eye, intermediate dry stage: Secondary | ICD-10-CM | POA: Diagnosis not present

## 2017-04-29 DIAGNOSIS — M5416 Radiculopathy, lumbar region: Secondary | ICD-10-CM | POA: Diagnosis not present

## 2017-05-30 ENCOUNTER — Emergency Department (HOSPITAL_BASED_OUTPATIENT_CLINIC_OR_DEPARTMENT_OTHER)
Admission: EM | Admit: 2017-05-30 | Discharge: 2017-05-30 | Disposition: A | Discharge status: Home / Self Care | Payer: Medicare Other | Attending: Emergency Medicine | Admitting: Emergency Medicine

## 2017-05-30 ENCOUNTER — Other Ambulatory Visit: Payer: Self-pay

## 2017-05-30 ENCOUNTER — Encounter (HOSPITAL_BASED_OUTPATIENT_CLINIC_OR_DEPARTMENT_OTHER): Payer: Self-pay | Admitting: Emergency Medicine

## 2017-05-30 ENCOUNTER — Emergency Department (HOSPITAL_BASED_OUTPATIENT_CLINIC_OR_DEPARTMENT_OTHER): Payer: Medicare Other

## 2017-05-30 DIAGNOSIS — J45909 Unspecified asthma, uncomplicated: Secondary | ICD-10-CM | POA: Diagnosis not present

## 2017-05-30 DIAGNOSIS — I10 Essential (primary) hypertension: Secondary | ICD-10-CM | POA: Diagnosis not present

## 2017-05-30 DIAGNOSIS — Z79899 Other long term (current) drug therapy: Secondary | ICD-10-CM | POA: Insufficient documentation

## 2017-05-30 DIAGNOSIS — K760 Fatty (change of) liver, not elsewhere classified: Secondary | ICD-10-CM | POA: Diagnosis not present

## 2017-05-30 DIAGNOSIS — R1032 Left lower quadrant pain: Secondary | ICD-10-CM | POA: Diagnosis not present

## 2017-05-30 DIAGNOSIS — N390 Urinary tract infection, site not specified: Secondary | ICD-10-CM | POA: Diagnosis not present

## 2017-05-30 DIAGNOSIS — R300 Dysuria: Secondary | ICD-10-CM | POA: Diagnosis not present

## 2017-05-30 DIAGNOSIS — I4891 Unspecified atrial fibrillation: Secondary | ICD-10-CM | POA: Diagnosis not present

## 2017-05-30 DIAGNOSIS — Z125 Encounter for screening for malignant neoplasm of prostate: Secondary | ICD-10-CM | POA: Diagnosis not present

## 2017-05-30 HISTORY — DX: Unspecified osteoarthritis, unspecified site: M19.90

## 2017-05-30 HISTORY — DX: Essential (primary) hypertension: I10

## 2017-05-30 HISTORY — DX: Solitary pulmonary nodule: R91.1

## 2017-05-30 HISTORY — DX: Unspecified asthma, uncomplicated: J45.909

## 2017-05-30 HISTORY — DX: Disorder of kidney and ureter, unspecified: N28.9

## 2017-05-30 HISTORY — DX: Presence of intraocular lens: Z96.1

## 2017-05-30 HISTORY — DX: Dorsalgia, unspecified: M54.9

## 2017-05-30 HISTORY — DX: Gastro-esophageal reflux disease without esophagitis: K21.9

## 2017-05-30 HISTORY — DX: Polyneuropathy, unspecified: G62.9

## 2017-05-30 HISTORY — DX: Malignant (primary) neoplasm, unspecified: C80.1

## 2017-05-30 HISTORY — DX: Benign prostatic hyperplasia without lower urinary tract symptoms: N40.0

## 2017-05-30 HISTORY — DX: Actinic keratosis: L57.0

## 2017-05-30 LAB — I-STAT CG4 LACTIC ACID, ED
LACTIC ACID, VENOUS: 1.08 mmol/L (ref 0.5–1.9)
Lactic Acid, Venous: 2.45 mmol/L (ref 0.5–1.9)

## 2017-05-30 LAB — COMPREHENSIVE METABOLIC PANEL
ALK PHOS: 74 U/L (ref 38–126)
ALT: 12 U/L — AB (ref 17–63)
AST: 18 U/L (ref 15–41)
Albumin: 3.8 g/dL (ref 3.5–5.0)
Anion gap: 9 (ref 5–15)
BUN: 19 mg/dL (ref 6–20)
CALCIUM: 8.8 mg/dL — AB (ref 8.9–10.3)
CO2: 24 mmol/L (ref 22–32)
Chloride: 104 mmol/L (ref 101–111)
Creatinine, Ser: 0.94 mg/dL (ref 0.61–1.24)
GFR calc non Af Amer: >60 mL/min (ref >60)
GLUCOSE: 154 mg/dL — AB (ref 65–99)
Potassium: 4 mmol/L (ref 3.5–5.1)
SODIUM: 137 mmol/L (ref 135–145)
Total Bilirubin: 0.7 mg/dL (ref 0.3–1.2)
Total Protein: 6 g/dL — ABNORMAL LOW (ref 6.5–8.1)

## 2017-05-30 LAB — CBC WITH DIFFERENTIAL/PLATELET
BASOS PCT: 1 %
Basophils Absolute: 0.1 10*3/uL (ref 0.0–0.1)
Eosinophils Absolute: 0.1 10*3/uL (ref 0.0–0.7)
Eosinophils Relative: 2 %
HCT: 41.8 % (ref 39.0–52.0)
Hemoglobin: 14.5 g/dL (ref 13.0–17.0)
Lymphocytes Relative: 30 %
Lymphs Abs: 1.7 10*3/uL (ref 0.7–4.0)
MCH: 31.2 pg (ref 26.0–34.0)
MCHC: 34.7 g/dL (ref 30.0–36.0)
MCV: 89.9 fL (ref 78.0–100.0)
MONO ABS: 0.4 10*3/uL (ref 0.1–1.0)
MONOS PCT: 7 %
NEUTROS PCT: 60 %
Neutro Abs: 3.4 10*3/uL (ref 1.7–7.7)
Platelets: 213 10*3/uL (ref 150–400)
RBC: 4.65 MIL/uL (ref 4.22–5.81)
RDW: 13.2 % (ref 11.5–15.5)
WBC: 5.6 10*3/uL (ref 4.0–10.5)

## 2017-05-30 LAB — URINALYSIS, ROUTINE W REFLEX MICROSCOPIC
BILIRUBIN URINE: NEGATIVE
Glucose, UA: NEGATIVE mg/dL
KETONES UR: 15 mg/dL — AB
Leukocytes, UA: NEGATIVE
NITRITE: NEGATIVE
Protein, ur: 100 mg/dL — AB
Specific Gravity, Urine: >1.03 — ABNORMAL HIGH (ref 1.005–1.030)
pH: 5.5 (ref 5.0–8.0)

## 2017-05-30 LAB — URINALYSIS, MICROSCOPIC (REFLEX)

## 2017-05-30 LAB — PROTIME-INR
INR: 1.2
PROTHROMBIN TIME: 15.1 s (ref 11.4–15.2)

## 2017-05-30 MED ORDER — CEPHALEXIN 500 MG PO CAPS: 500.0000 mg | ORAL_CAPSULE | Freq: Four times a day (QID) | ORAL | 0 refills | Status: AC

## 2017-05-30 MED ORDER — IOPAMIDOL (ISOVUE-300) INJECTION 61%
100.0000 mL | Freq: Once | INTRAVENOUS | Status: AC | PRN
  Administered 2017-05-30: 100 mL via INTRAVENOUS

## 2017-05-30 MED ORDER — SODIUM CHLORIDE 0.9 % IV BOLUS
1000.0000 mL | Freq: Once | INTRAVENOUS | Status: AC
  Administered 2017-05-30: 1000 mL via INTRAVENOUS

## 2017-05-30 MED ORDER — SODIUM CHLORIDE 0.9 % IV BOLUS: 1000.0000 mL | Freq: Once | INTRAVENOUS | Status: DC

## 2017-05-30 MED ORDER — SODIUM CHLORIDE 0.9 % IV SOLN
1.0000 g | Freq: Once | INTRAVENOUS | Status: AC
  Administered 2017-05-30: 1 g via INTRAVENOUS
  Filled 2017-05-30: qty 10

## 2017-05-30 NOTE — Discharge Instructions (Addendum)
You may have diarrhea from the antibiotics.  It is very important that you continue to take the antibiotics even if you get diarrhea unless a medical professional tells you that you may stop taking them.  If you stop too early the bacteria you are being treated for will become stronger and you may need different, more powerful antibiotics that have more side effects and worsening diarrhea.  Please stay well hydrated and consider probiotics as they may decrease the severity of your diarrhea.    Please make sure you stay well hydrated.    You have cultures pending.  If you develop fevers, chills, nausea or vomiting, or have any worsening symptoms or concerns please go to your preferred emergency department.

## 2017-05-30 NOTE — ED Provider Notes (Signed)
[facility] EMERGENCY DEPARTMENT Provider Note   CSN: [id number] Arrival date & time: 05/30/17  1440     History   Chief Complaint Chief Complaint  Patient presents with  . Flank Pain    HPI Jonathan Gordon is a 84 y.o. male with past medical history of BPH, A. fib, hypertension, kidney stones over 20 years ago, who presents today for evaluation of left-sided flank pain and left lower quadrant pain.  He reports that it has been going on for the past week or so.  He was initially evaluated by urgent care and sent here.  He reports that he is having increased urinary urgency along with occasional incontinence.  He denies any fevers or chills.  He does report blood in his urine and that it feels like inside his urethra is burning.  He denies any external lesions or testicular pain.  He does report he has a history of "blood blisters" on his scrotum which are unchanged.   He reports diarrhea with out blood and increased bowel movements.    HPI  Past Medical History:  Diagnosis Date  . Actinic keratoses   . Arthritis   . Asthma   . Atrial fibrillation ([place])   . Back pain   . BPH (benign prostatic hyperplasia)   . Cancer ([place])    skin  . GERD (gastroesophageal reflux disease)   . Hypertension   . Neuropathy   . Pseudophakia   . Pulmonary nodule   . Renal disorder    kidney stone    Patient Active Problem List   Diagnosis Date Noted  . OSA (obstructive sleep apnea) 12/26/2016  . Upper airway cough syndrome 11/28/2016  . Dyspnea on exertion 08/26/2016  . Bronchiectasis without complication ([place]) 67/59/1638    Past Surgical History:  Procedure Laterality Date  . BAND HEMORRHOIDECTOMY    . CARDIAC CATHETERIZATION    . HEMORRHOID SURGERY          Home Medications    Prior to Admission medications   Medication Sig Start Date End Date Taking? Authorizing Provider  azelastine (ASTELIN) 0.1 % nasal spray Place 1 spray into both nostrils 2 (two) times daily. Use  in each nostril as directed 12/26/16   Tanda Rockers, MD  Azelastine-Fluticasone [facility]) 137-50 MCG/ACT SUSP Place 1 spray into the nose 2 (two) times daily. 12/26/16   Tanda Rockers, MD  cephALEXin (KEFLEX) 500 MG capsule Take 1 capsule (500 mg total) by mouth 4 (four) times daily for 10 days. 05/30/17 06/09/17  Lorin Glass, PA-C  diltiazem (DILACOR XR) 180 MG 24 hr capsule Take 180 mg by mouth daily.    [provider]  docusate sodium (COLACE) 100 MG capsule Take 100 mg by mouth daily as needed for mild constipation.    [provider]  edoxaban (SAVAYSA) 60 MG TABS tablet Take 60 mg by mouth daily.    [provider]  Ergocalciferol (VITAMIN D2) 2000 units TABS Take 1 tablet by mouth daily.    [provider]  finasteride (PROSCAR) 5 MG tablet Take 5 mg by mouth daily.    [provider]  fluticasone (FLONASE) 50 MCG/ACT nasal spray Place 1 spray into both nostrils 2 (two) times daily. 12/26/16   Tanda Rockers, MD  ibuprofen (ADVIL,MOTRIN) 800 MG tablet Take 800 mg by mouth every 8 (eight) hours as needed.    [provider]  LORazepam (ATIVAN) 1 MG tablet Take 1 mg by mouth daily as  needed for anxiety.    [provider]  Magnesium 250 MG TABS Take 1 tablet by mouth daily.    [provider]  metoprolol succinate (TOPROL-XL) 25 MG 24 hr tablet Take 25 mg by mouth daily.    [provider]  Multiple Vitamins-Minerals (CENTRUM SILVER PO) Take 1 tablet by mouth daily.    [provider]  Vitamins-Lipotropics (SUPER B-50 COMPLEX PLUS PO) Take 1 tablet by mouth daily.    [provider]    Family History History reviewed. No pertinent family history.  Social History Social History   Tobacco Use  . Smoking status: Never Smoker  . Smokeless tobacco: Never Used  Substance Use Topics  . Alcohol use: No  . Drug use: No     Allergies   Coumadin [warfarin sodium]; Oxycodone;  Oxycontin [oxycodone hcl]; and Xarelto [rivaroxaban]   Review of Systems Review of Systems  Constitutional: Negative for appetite change, chills and fever.  HENT: Negative for ear pain and sore throat.   Eyes: Negative for pain and visual disturbance.  Respiratory: Negative for cough and shortness of breath.   Cardiovascular: Negative for chest pain and palpitations.  Gastrointestinal: Positive for abdominal pain and diarrhea. Negative for blood in stool, nausea, rectal pain and vomiting.  Endocrine: Negative for polyuria.  Genitourinary: Positive for flank pain, frequency, hematuria, penile pain (Burning) and urgency. Negative for dysuria, genital sores, penile swelling, scrotal swelling and testicular pain.  Musculoskeletal: Negative for arthralgias and back pain.  Skin: Negative for color change and rash.  Neurological: Negative for seizures, syncope and headaches.  All other systems reviewed and are negative.    Physical Exam Updated Vital Signs BP 117/75 (BP Location: Left Arm)   Pulse 62   Temp 97.6 F (36.4 C) (Oral)   Resp (!) 24   Ht '5\' 8"'$  (1.727 m)   Wt 87.1 kg (192 lb)   SpO2 96%   BMI 29.19 kg/m   Physical Exam  Constitutional: He is oriented to person, place, and time. He appears well-developed and well-nourished.  HENT:  Head: Normocephalic and atraumatic.  Eyes: Conjunctivae are normal.  Neck: Neck supple.  Cardiovascular: Normal rate and normal heart sounds.  No murmur heard. Pulmonary/Chest: Effort normal and breath sounds normal. No respiratory distress.  Abdominal: Soft. He exhibits no mass. There is tenderness in the periumbilical area, suprapubic area and left lower quadrant. There is no rebound.  Genitourinary: Testes normal and penis normal. Right testis shows no swelling and no tenderness. Left testis shows no swelling and no tenderness. Uncircumcised. No phimosis, penile erythema or penile tenderness. No discharge found.  Genitourinary Comments:  Exam performed with chaperone in room.  Left scrotum has multiple small red lesions.   Musculoskeletal: He exhibits no edema.  Neurological: He is alert and oriented to person, place, and time.  Skin: Skin is warm and dry.  Psychiatric: He has a normal mood and affect.  Nursing note and vitals reviewed.    ED Treatments / Results  Labs (all labs ordered are listed, but only abnormal results are displayed) Labs Reviewed  COMPREHENSIVE METABOLIC PANEL - Abnormal; Notable for the following components:      Result Value   Glucose, Bld 154 (*)    Calcium 8.8 (*)    Total Protein 6.0 (*)    ALT 12 (*)    All other components within normal limits  URINALYSIS, ROUTINE W REFLEX MICROSCOPIC - Abnormal; Notable for the following components:   APPearance CLOUDY (*)  Specific Gravity, Urine >1.030 (*)    Hgb urine dipstick LARGE (*)    Ketones, ur 15 (*)    Protein, ur 100 (*)    All other components within normal limits  URINALYSIS, MICROSCOPIC (REFLEX) - Abnormal; Notable for the following components:   Bacteria, UA FEW (*)    All other components within normal limits  I-STAT CG4 LACTIC ACID, ED - Abnormal; Notable for the following components:   Lactic Acid, Venous 2.45 (*)    All other components within normal limits  CULTURE, BLOOD (ROUTINE X 2)  CULTURE, BLOOD (ROUTINE X 2)  URINE CULTURE  CBC WITH DIFFERENTIAL/PLATELET  PROTIME-INR  I-STAT CG4 LACTIC ACID, ED    EKG EKG Interpretation  Date/Time:  Saturday May 30 2017 15:53:49 EDT Ventricular Rate:  59 PR Interval:    QRS Duration: 94 QT Interval:  448 QTC Calculation: 444 R Axis:   81 Text Interpretation:  Atrial fibrillation Borderline right axis deviation Low voltage, precordial leads Confirmed by Elnora Morrison [phone]) on 05/31/2017 11:52:27 AM   Radiology Ct Abdomen Pelvis W Contrast  Result Date: 05/30/2017 CLINICAL DATA:  Hematuria and lower abdominal pain. EXAM: CT ABDOMEN AND PELVIS WITH CONTRAST TECHNIQUE:  Multidetector CT imaging of the abdomen and pelvis was performed using the standard protocol following bolus administration of intravenous contrast. CONTRAST:  169m ISOVUE-300 IOPAMIDOL (ISOVUE-300) INJECTION 61% COMPARISON:  None. FINDINGS: Lower chest: No acute abnormality. Hepatobiliary: Hepatic steatosis. No focal liver abnormality. The gallbladder is unremarkable. No biliary dilatation. Pancreas: Unremarkable. No pancreatic ductal dilatation or surrounding inflammatory changes. Spleen: Normal in size without focal abnormality. Adrenals/Urinary Tract: The adrenal glands are unremarkable. Bilateral simple appearing renal cysts measuring up to 3.7 cm. Other subcentimeter low-density lesions in the right kidney are too small to characterize. No renal or ureteral calculi. No hydronephrosis. Mild circumferential bladder wall thickening. Stomach/Bowel: Stomach is within normal limits. Appendix appears normal. No evidence of bowel wall thickening, distention, or inflammatory changes. Vascular/Lymphatic: Aortic atherosclerosis. No enlarged abdominal or pelvic lymph nodes. Reproductive: The prostate is mildly enlarged and contains coarse central calcifications. Other: No abdominal wall hernia or abnormality. No abdominopelvic ascites. No pneumoperitoneum. Musculoskeletal: No acute or significant osseous findings. IMPRESSION: 1.  No acute intra-abdominal process. 2. Mild circumferential bladder wall thickening may be related to underdistention versus cystitis. Correlate with urinalysis. 3. Hepatic steatosis. 4.  Aortic atherosclerosis (ICD10-I70.0). Electronically Signed   By: WTitus DubinM.D.   On: 05/30/2017 17:19    Procedures Procedures (including critical care time)  Medications Ordered in ED Medications  sodium chloride 0.9 % bolus 1,000 mL (0 mLs Intravenous Stopped 05/30/17 1749)  iopamidol (ISOVUE-300) 61 % injection 100 mL (100 mLs Intravenous Contrast Given 05/30/17 1648)  cefTRIAXone (ROCEPHIN) 1 g  in sodium chloride 0.9 % 100 mL IVPB (0 g Intravenous Stopped 05/30/17 1851)     Initial Impression / Assessment and Plan / ED Course  I have reviewed the triage vital signs and the nursing notes.  Pertinent labs & imaging results that were available during my care of the patient were reviewed by me and considered in my medical decision making (see chart for details).  Clinical Course as of May 31 2328  Sat May 30, 2017  1524 1 liter fluid bolus ordered.   I-Stat CG4 Lactic Acid, ED(!!) [EH]  1640 UA with out nitrite or leukocytes on dip, microscopic with RBC, WBC, few bacteria with ca oxylate crystals present.    Urinalysis, Routine w reflex microscopic(!) [EH]  1750 Patient re-evaluated, in room respiratory rate is 16-18.  He is feeling well, discussed plan and diagnosis.    [EH]    Clinical Course User Index [EH] Lorin Glass, PA-C   Corie Vavra is a 84 year old male who presents today for evaluation of left-sided flank and abdominal pain.  Basic labs are obtained, CBC without leukocytosis or anemia.  CMP with normal AST, ALT, alk phos and creatinine.  Initial lactic acid mildly elevated at 2.45.  Patient was given a 1 L fluid bolus.  Urine was cloudy with large blood, 100 protein, few bacteria on microscopic along with calcium oxalate crystals.  CT scan was performed which did not show any evidence of kidney stone.  Does show bladder thickening consistent with cystitis.  Is consistent with UA and symptoms.  As patient initially had a elevated lactic acid he was treated with a dose of IV Rocephin while in the emergency room.  Blood and urine cultures were obtained prior to antibiotics.  He is a prescription for Keflex to take at home.  Repeat lactic acid was normal, patient was afebrile, not tachycardic, did not meet sepsis criteria, was generally well-appearing.  Patient has close follow-up.  Return precautions were discussed with patient, and he states his understanding.  Patient  was seen as a shared visit with Dr. Tamera Punt who evaluated the patient and agreed with my plan.  Final Clinical Impressions(s) / ED Diagnoses   Final diagnoses:  Lower urinary tract infectious disease    ED Discharge Orders        Ordered    cephALEXin (KEFLEX) 500 MG capsule  4 times daily     05/30/17 1853       Lorin Glass, PA-C 06/01/17 4174    Malvin Johns, MD 06/06/17 2025

## 2017-05-30 NOTE — ED Notes (Signed)
ED Provider at bedside. 

## 2017-05-30 NOTE — ED Triage Notes (Signed)
Patient states that for the last week he has had pain to his left flank and down into his groin. It has become progressively worse and his penis now burns all the time and he has blood in his urine

## 2017-06-01 LAB — URINE CULTURE

## 2017-06-02 DIAGNOSIS — L820 Inflamed seborrheic keratosis: Secondary | ICD-10-CM | POA: Diagnosis not present

## 2017-06-02 DIAGNOSIS — D0422 Carcinoma in situ of skin of left ear and external auricular canal: Secondary | ICD-10-CM | POA: Diagnosis not present

## 2017-06-02 DIAGNOSIS — D485 Neoplasm of uncertain behavior of skin: Secondary | ICD-10-CM | POA: Diagnosis not present

## 2017-06-02 DIAGNOSIS — L570 Actinic keratosis: Secondary | ICD-10-CM | POA: Diagnosis not present

## 2017-06-02 DIAGNOSIS — Z85828 Personal history of other malignant neoplasm of skin: Secondary | ICD-10-CM | POA: Diagnosis not present

## 2017-06-02 DIAGNOSIS — L821 Other seborrheic keratosis: Secondary | ICD-10-CM | POA: Diagnosis not present

## 2017-06-02 DIAGNOSIS — C44229 Squamous cell carcinoma of skin of left ear and external auricular canal: Secondary | ICD-10-CM | POA: Diagnosis not present

## 2017-06-04 LAB — CULTURE, BLOOD (ROUTINE X 2)
Culture: NO GROWTH
Culture: NO GROWTH
SPECIAL REQUESTS: ADEQUATE
Special Requests: ADEQUATE

## 2017-06-11 ENCOUNTER — Encounter (HOSPITAL_BASED_OUTPATIENT_CLINIC_OR_DEPARTMENT_OTHER): Payer: Self-pay | Admitting: *Deleted

## 2017-06-11 ENCOUNTER — Emergency Department (HOSPITAL_BASED_OUTPATIENT_CLINIC_OR_DEPARTMENT_OTHER): Payer: Medicare Other

## 2017-06-11 ENCOUNTER — Observation Stay (HOSPITAL_BASED_OUTPATIENT_CLINIC_OR_DEPARTMENT_OTHER)
Admission: EM | Admit: 2017-06-11 | Discharge: 2017-06-12 | Disposition: A | Discharge status: Home / Self Care | Payer: Medicare Other | Attending: Internal Medicine | Admitting: Internal Medicine

## 2017-06-11 ENCOUNTER — Observation Stay (HOSPITAL_COMMUNITY): Payer: Medicare Other

## 2017-06-11 ENCOUNTER — Other Ambulatory Visit: Payer: Self-pay

## 2017-06-11 DIAGNOSIS — G4733 Obstructive sleep apnea (adult) (pediatric): Secondary | ICD-10-CM | POA: Diagnosis present

## 2017-06-11 DIAGNOSIS — N281 Cyst of kidney, acquired: Secondary | ICD-10-CM | POA: Insufficient documentation

## 2017-06-11 DIAGNOSIS — Z888 Allergy status to other drugs, medicaments and biological substances status: Secondary | ICD-10-CM | POA: Diagnosis not present

## 2017-06-11 DIAGNOSIS — Z87442 Personal history of urinary calculi: Secondary | ICD-10-CM | POA: Insufficient documentation

## 2017-06-11 DIAGNOSIS — R05 Cough: Secondary | ICD-10-CM | POA: Diagnosis not present

## 2017-06-11 DIAGNOSIS — K219 Gastro-esophageal reflux disease without esophagitis: Secondary | ICD-10-CM | POA: Diagnosis not present

## 2017-06-11 DIAGNOSIS — Z85828 Personal history of other malignant neoplasm of skin: Secondary | ICD-10-CM | POA: Insufficient documentation

## 2017-06-11 DIAGNOSIS — M5126 Other intervertebral disc displacement, lumbar region: Secondary | ICD-10-CM | POA: Diagnosis not present

## 2017-06-11 DIAGNOSIS — G629 Polyneuropathy, unspecified: Secondary | ICD-10-CM | POA: Diagnosis not present

## 2017-06-11 DIAGNOSIS — F419 Anxiety disorder, unspecified: Secondary | ICD-10-CM | POA: Insufficient documentation

## 2017-06-11 DIAGNOSIS — K760 Fatty (change of) liver, not elsewhere classified: Secondary | ICD-10-CM | POA: Diagnosis not present

## 2017-06-11 DIAGNOSIS — M545 Low back pain, unspecified: Secondary | ICD-10-CM | POA: Diagnosis present

## 2017-06-11 DIAGNOSIS — Z885 Allergy status to narcotic agent status: Secondary | ICD-10-CM | POA: Insufficient documentation

## 2017-06-11 DIAGNOSIS — Z7901 Long term (current) use of anticoagulants: Secondary | ICD-10-CM | POA: Insufficient documentation

## 2017-06-11 DIAGNOSIS — Z79899 Other long term (current) drug therapy: Secondary | ICD-10-CM | POA: Diagnosis not present

## 2017-06-11 DIAGNOSIS — N400 Enlarged prostate without lower urinary tract symptoms: Secondary | ICD-10-CM | POA: Insufficient documentation

## 2017-06-11 DIAGNOSIS — I700 Atherosclerosis of aorta: Secondary | ICD-10-CM | POA: Insufficient documentation

## 2017-06-11 DIAGNOSIS — R1032 Left lower quadrant pain: Secondary | ICD-10-CM | POA: Insufficient documentation

## 2017-06-11 DIAGNOSIS — K7689 Other specified diseases of liver: Secondary | ICD-10-CM | POA: Diagnosis not present

## 2017-06-11 DIAGNOSIS — I10 Essential (primary) hypertension: Secondary | ICD-10-CM | POA: Insufficient documentation

## 2017-06-11 DIAGNOSIS — J45909 Unspecified asthma, uncomplicated: Secondary | ICD-10-CM | POA: Insufficient documentation

## 2017-06-11 DIAGNOSIS — G8929 Other chronic pain: Secondary | ICD-10-CM | POA: Diagnosis not present

## 2017-06-11 DIAGNOSIS — R058 Other specified cough: Secondary | ICD-10-CM | POA: Diagnosis present

## 2017-06-11 DIAGNOSIS — I482 Chronic atrial fibrillation, unspecified: Secondary | ICD-10-CM | POA: Diagnosis present

## 2017-06-11 HISTORY — DX: Chronic atrial fibrillation, unspecified: I48.20

## 2017-06-11 LAB — COMPREHENSIVE METABOLIC PANEL
ALT: 12 U/L — AB (ref 17–63)
ANION GAP: 10 (ref 5–15)
AST: 17 U/L (ref 15–41)
Albumin: 4.2 g/dL (ref 3.5–5.0)
Alkaline Phosphatase: 85 U/L (ref 38–126)
BUN: 15 mg/dL (ref 6–20)
CHLORIDE: 105 mmol/L (ref 101–111)
CO2: 24 mmol/L (ref 22–32)
Calcium: 8.9 mg/dL (ref 8.9–10.3)
Creatinine, Ser: 0.77 mg/dL (ref 0.61–1.24)
GFR calc non Af Amer: >60 mL/min (ref >60)
Glucose, Bld: 122 mg/dL — ABNORMAL HIGH (ref 65–99)
POTASSIUM: 3.9 mmol/L (ref 3.5–5.1)
SODIUM: 139 mmol/L (ref 135–145)
Total Bilirubin: 0.6 mg/dL (ref 0.3–1.2)
Total Protein: 6.6 g/dL (ref 6.5–8.1)

## 2017-06-11 LAB — URINALYSIS, ROUTINE W REFLEX MICROSCOPIC
Bilirubin Urine: NEGATIVE
Glucose, UA: NEGATIVE mg/dL
Hgb urine dipstick: NEGATIVE
Ketones, ur: NEGATIVE mg/dL
LEUKOCYTES UA: NEGATIVE
Nitrite: NEGATIVE
Protein, ur: NEGATIVE mg/dL
SPECIFIC GRAVITY, URINE: 1.02 (ref 1.005–1.030)
pH: 6.5 (ref 5.0–8.0)

## 2017-06-11 LAB — CBC
HEMATOCRIT: 46.1 % (ref 39.0–52.0)
HEMOGLOBIN: 16.1 g/dL (ref 13.0–17.0)
MCH: 30.7 pg (ref 26.0–34.0)
MCHC: 34.9 g/dL (ref 30.0–36.0)
MCV: 88 fL (ref 78.0–100.0)
Platelets: 206 10*3/uL (ref 150–400)
RBC: 5.24 MIL/uL (ref 4.22–5.81)
RDW: 13.2 % (ref 11.5–15.5)
WBC: 5.2 10*3/uL (ref 4.0–10.5)

## 2017-06-11 LAB — LIPASE, BLOOD: LIPASE: 25 U/L (ref 11–51)

## 2017-06-11 MED ORDER — HYDROMORPHONE HCL 1 MG/ML IJ SOLN
0.5000 mg | INTRAMUSCULAR | Status: DC | PRN
  Administered 2017-06-11: 0.5 mg via INTRAVENOUS
  Filled 2017-06-11: qty 0.5

## 2017-06-11 MED ORDER — MORPHINE SULFATE (PF) 4 MG/ML IV SOLN
4.0000 mg | Freq: Once | INTRAVENOUS | Status: AC
  Administered 2017-06-11: 4 mg via INTRAVENOUS
  Filled 2017-06-11: qty 1

## 2017-06-11 MED ORDER — LIDOCAINE 5 % EX PTCH
1.0000 | MEDICATED_PATCH | CUTANEOUS | Status: DC
  Administered 2017-06-11 – 2017-06-12 (×2): 1 via TRANSDERMAL
  Filled 2017-06-11 (×2): qty 1

## 2017-06-11 MED ORDER — METOPROLOL SUCCINATE ER 25 MG PO TB24: 25.0000 mg | ORAL_TABLET | Freq: Every day | ORAL | Status: DC

## 2017-06-11 MED ORDER — DILTIAZEM HCL ER COATED BEADS 180 MG PO CP24
180.0000 mg | ORAL_CAPSULE | Freq: Every day | ORAL | Status: DC
  Administered 2017-06-11 – 2017-06-12 (×2): 180 mg via ORAL
  Filled 2017-06-11 (×2): qty 1

## 2017-06-11 MED ORDER — HYDROMORPHONE HCL 1 MG/ML IJ SOLN
1.0000 mg | Freq: Once | INTRAMUSCULAR | Status: AC
  Administered 2017-06-11: 1 mg via INTRAVENOUS
  Filled 2017-06-11: qty 1

## 2017-06-11 MED ORDER — IOPAMIDOL (ISOVUE-300) INJECTION 61%
100.0000 mL | Freq: Once | INTRAVENOUS | Status: AC | PRN
  Administered 2017-06-11: 100 mL via INTRAVENOUS

## 2017-06-11 MED ORDER — ONDANSETRON HCL 4 MG/2ML IJ SOLN
4.0000 mg | Freq: Once | INTRAMUSCULAR | Status: AC
  Administered 2017-06-11: 4 mg via INTRAVENOUS
  Filled 2017-06-11: qty 2

## 2017-06-11 MED ORDER — DILTIAZEM HCL ER 180 MG PO CP24
180.0000 mg | ORAL_CAPSULE | Freq: Every day | ORAL | Status: DC
  Filled 2017-06-11: qty 1

## 2017-06-11 MED ORDER — CENTRUM SILVER PO CHEW: CHEWABLE_TABLET | Freq: Every day | ORAL | Status: DC

## 2017-06-11 MED ORDER — ONDANSETRON HCL 4 MG PO TABS
4.0000 mg | ORAL_TABLET | Freq: Four times a day (QID) | ORAL | Status: DC | PRN
  Administered 2017-06-11: 4 mg via ORAL
  Filled 2017-06-11: qty 1

## 2017-06-11 MED ORDER — METOPROLOL SUCCINATE ER 25 MG PO TB24
12.5000 mg | ORAL_TABLET | Freq: Every day | ORAL | Status: DC
  Administered 2017-06-11 – 2017-06-12 (×2): 12.5 mg via ORAL
  Filled 2017-06-11 (×2): qty 1

## 2017-06-11 MED ORDER — VITAMIN D2 50 MCG (2000 UT) PO TABS: 1.0000 | ORAL_TABLET | Freq: Every day | ORAL | Status: DC

## 2017-06-11 MED ORDER — ADULT MULTIVITAMIN W/MINERALS CH
1.0000 | ORAL_TABLET | Freq: Every day | ORAL | Status: DC
  Administered 2017-06-11 – 2017-06-12 (×2): 1 via ORAL
  Filled 2017-06-11 (×2): qty 1

## 2017-06-11 MED ORDER — GABAPENTIN 300 MG PO CAPS: 300.0000 mg | ORAL_CAPSULE | Freq: Three times a day (TID) | ORAL | Status: DC

## 2017-06-11 MED ORDER — GABAPENTIN 300 MG PO CAPS: 300.0000 mg | ORAL_CAPSULE | Freq: Two times a day (BID) | ORAL | Status: DC

## 2017-06-11 MED ORDER — ONDANSETRON HCL 4 MG/2ML IJ SOLN: 4.0000 mg | Freq: Four times a day (QID) | INTRAMUSCULAR | Status: DC | PRN

## 2017-06-11 MED ORDER — GABAPENTIN 300 MG PO CAPS
900.0000 mg | ORAL_CAPSULE | Freq: Every day | ORAL | Status: DC
  Administered 2017-06-11: 900 mg via ORAL
  Filled 2017-06-11: qty 3

## 2017-06-11 MED ORDER — APIXABAN 5 MG PO TABS
5.0000 mg | ORAL_TABLET | Freq: Two times a day (BID) | ORAL | Status: DC
  Administered 2017-06-11 – 2017-06-12 (×3): 5 mg via ORAL
  Filled 2017-06-11 (×3): qty 1

## 2017-06-11 MED ORDER — OXYCODONE HCL 5 MG PO TABS: 5.0000 mg | ORAL_TABLET | ORAL | Status: DC | PRN

## 2017-06-11 MED ORDER — LORAZEPAM 1 MG PO TABS
1.0000 mg | ORAL_TABLET | Freq: Every day | ORAL | Status: DC | PRN
Start: 2017-06-11 — End: 2017-06-12

## 2017-06-11 MED ORDER — VITAMIN D 1000 UNITS PO TABS
2000.0000 [IU] | ORAL_TABLET | Freq: Every day | ORAL | Status: DC
  Administered 2017-06-11 – 2017-06-12 (×2): 2000 [IU] via ORAL
  Filled 2017-06-11 (×2): qty 2

## 2017-06-11 NOTE — ED Provider Notes (Signed)
[facility] EMERGENCY DEPARTMENT Provider Note   CSN: [id number] Arrival date & time: 06/11/17  1194     History   Chief Complaint Chief Complaint  Patient presents with  . Abdominal Pain    HPI Jonathan Gordon is a 84 y.o. male.  Patient is an 84 year old male with past medical history of atrial fibrillation, BPH, hypertension, and GERD.  He presents today for evaluation of left lower quadrant abdominal pain.  This started earlier this morning and woke him from sleep.  He reports a constant pain with episodes of spasm and cramping that become severe, then ease up.  He denies any fevers or chills.  He denies any urinary complaints.  He denies any constipation or diarrhea.  He was seen here nearly 2 weeks ago with a similar presentation.  He was found to have a urinary tract infection.  He tells me that this has cleared up and he is no longer having any burning.  He just recently completed his course of antibiotics.  The history is provided by the patient.  Abdominal Pain   This is a recurrent problem. The current episode started 3 to 5 hours ago. The problem occurs constantly. The problem has been rapidly worsening. The pain is associated with an unknown factor. The pain is located in the LLQ. The quality of the pain is cramping. The pain is severe. Pertinent negatives include fever, diarrhea, melena, nausea, vomiting, constipation, dysuria, frequency and hematuria. The symptoms are aggravated by activity and palpation. Nothing relieves the symptoms.    Past Medical History:  Diagnosis Date  . Actinic keratoses   . Arthritis   . Asthma   . Atrial fibrillation ([place])   . Back pain   . BPH (benign prostatic hyperplasia)   . Cancer ([place])    skin  . GERD (gastroesophageal reflux disease)   . Hypertension   . Neuropathy   . Pseudophakia   . Pulmonary nodule   . Renal disorder    kidney stone    Patient Active Problem List   Diagnosis Date Noted  . OSA (obstructive  sleep apnea) 12/26/2016  . Upper airway cough syndrome 11/28/2016  . Dyspnea on exertion 08/26/2016  . Bronchiectasis without complication ([place]) 17/40/8144    Past Surgical History:  Procedure Laterality Date  . BAND HEMORRHOIDECTOMY    . CARDIAC CATHETERIZATION    . HEMORRHOID SURGERY          Home Medications    Prior to Admission medications   Medication Sig Start Date End Date Taking? Authorizing Provider  azelastine (ASTELIN) 0.1 % nasal spray Place 1 spray into both nostrils 2 (two) times daily. Use in each nostril as directed 12/26/16   Tanda Rockers, MD  Azelastine-Fluticasone [facility]) 137-50 MCG/ACT SUSP Place 1 spray into the nose 2 (two) times daily. 12/26/16   Tanda Rockers, MD  diltiazem (DILACOR XR) 180 MG 24 hr capsule Take 180 mg by mouth daily.    [provider]  docusate sodium (COLACE) 100 MG capsule Take 100 mg by mouth daily as needed for mild constipation.    [provider]  edoxaban (SAVAYSA) 60 MG TABS tablet Take 60 mg by mouth daily.    [provider]  Ergocalciferol (VITAMIN D2) 2000 units TABS Take 1 tablet by mouth daily.    [provider]  finasteride (PROSCAR) 5 MG tablet Take 5 mg by mouth daily.    [provider]  fluticasone (FLONASE) 50 MCG/ACT nasal spray  Place 1 spray into both nostrils 2 (two) times daily. 12/26/16   Tanda Rockers, MD  ibuprofen (ADVIL,MOTRIN) 800 MG tablet Take 800 mg by mouth every 8 (eight) hours as needed.    [provider]  LORazepam (ATIVAN) 1 MG tablet Take 1 mg by mouth daily as needed for anxiety.    [provider]  Magnesium 250 MG TABS Take 1 tablet by mouth daily.    [provider]  metoprolol succinate (TOPROL-XL) 25 MG 24 hr tablet Take 25 mg by mouth daily.    [provider]  Multiple Vitamins-Minerals (CENTRUM SILVER PO) Take 1 tablet by mouth daily.    [provider]  Vitamins-Lipotropics (SUPER B-50 COMPLEX  PLUS PO) Take 1 tablet by mouth daily.    [provider]    Family History No family history on file.  Social History Social History   Tobacco Use  . Smoking status: Never Smoker  . Smokeless tobacco: Never Used  Substance Use Topics  . Alcohol use: No  . Drug use: No     Allergies   Coumadin [warfarin sodium]; Oxycodone; Oxycontin [oxycodone hcl]; and Xarelto [rivaroxaban]   Review of Systems Review of Systems  Constitutional: Negative for fever.  Gastrointestinal: Positive for abdominal pain. Negative for constipation, diarrhea, melena, nausea and vomiting.  Genitourinary: Negative for dysuria, frequency and hematuria.  All other systems reviewed and are negative.    Physical Exam Updated Vital Signs BP (!) 146/91 (BP Location: Right Arm)   Pulse 99   Temp 97.7 F (36.5 C) (Oral)   Resp 19   SpO2 95%   Physical Exam  Constitutional: He is oriented to person, place, and time. He appears well-developed and well-nourished. No distress.  HENT:  Head: Normocephalic and atraumatic.  Mouth/Throat: Oropharynx is clear and moist.  Neck: Normal range of motion. Neck supple.  Cardiovascular: Normal rate and regular rhythm. Exam reveals no friction rub.  No murmur heard. Pulmonary/Chest: Effort normal and breath sounds normal. No respiratory distress. He has no wheezes. He has no rales.  Abdominal: Soft. Bowel sounds are normal. He exhibits no distension. There is tenderness in the left lower quadrant. There is no rigidity, no rebound and no guarding.  Musculoskeletal: Normal range of motion. He exhibits no edema.  Neurological: He is alert and oriented to person, place, and time. Coordination normal.  Skin: Skin is warm and dry. He is not diaphoretic.  Nursing note and vitals reviewed.    ED Treatments / Results  Labs (all labs ordered are listed, but only abnormal results are displayed) Labs Reviewed  CBC  LIPASE, BLOOD  COMPREHENSIVE METABOLIC PANEL    URINALYSIS, ROUTINE W REFLEX MICROSCOPIC    EKG None  Radiology No results found.  Procedures Procedures (including critical care time)  Medications Ordered in ED Medications  morphine 4 MG/ML injection 4 mg (has no administration in time range)  ondansetron (ZOFRAN) injection 4 mg (has no administration in time range)     Initial Impression / Assessment and Plan / ED Course  I have reviewed the triage vital signs and the nursing notes.  Pertinent labs & imaging results that were available during my care of the patient were reviewed by me and considered in my medical decision making (see chart for details).  Patient presents here with complaints of left lower quadrant pain.  He was recently diagnosed with a urinary tract infection and treated with antibiotics.  His urine today appears clear.  He is  complaining of severe intermittent pains to the left lower quadrant that make it difficult for him to ambulate and function.  His work-up today reveals no leukocytosis and laboratory studies which are otherwise unremarkable.  Abdominal CT was obtained, this time with contrast, however fails to show any significant abnormality.  Despite receiving morphine, the patient remains in significant discomfort.  The family is very uncomfortable with him leaving without a better explanation for his symptoms.  I will discuss with the hospitalist at [facility] long to consider admission.  Dr. Alcario Drought agrees to admit.  Final Clinical Impressions(s) / ED Diagnoses   Final diagnoses:  None    ED Discharge Orders    None       Veryl Speak, MD 06/11/17 [phone]

## 2017-06-11 NOTE — H&P (Signed)
History and Physical    Axel Frisk OEU:[id number] DOB: Nov 22, 1931 DOA: 06/11/2017  PCP: Patient, No Pcp Per   Patient coming from: home    Chief Complaint: back/LLQ pain  HPI: Jonathan Gordon is a 84 y.o. male with medical history significant of chronic atrial fibrillation on apixaban, benign prostatic hypertrophy, nephrolithiasis, chronic low back pain who comes in with severe left-sided low back pain and left lower quadrant pain.  Patient reports that yesterday he was trying to start a lawnmower and he pulled several times and began to have severe lower back pain.  This is similar to his prior flareups of his lower back pain for which she is received steroid injections.  He reportedly had an MRI approximately 3 months ago.  He took 2 of his wife's hydrocodone for this lower back pain and try to lay down to sleep.  The back pain then started wrapping around to the left lower quadrant.  The pain is sharp and lasts for about 1 minute.  It waxes and wanes with approximately 20 to 30-minute intervals.  The pain is incapacitating and nonradiating.  He denies any nausea, vomiting, abdominal pain, fevers, cough, congestion, chest pain, diarrhea, constipation.  ED Course: Patient was seen at [facility] where his vitals were relatively unremarkable.  His CT of the abdomen pelvis showed no acute process.  Patient was given IV pain control with minimal improvement in his pain and was requesting admission to the hospital for further work-up.  Review of Systems: As per HPI otherwise 10 point review of systems negative.    Past Medical History:  Diagnosis Date  . Actinic keratoses   . Arthritis   . Asthma   . Atrial fibrillation ([place])   . Atrial fibrillation, chronic ([place]) 06/11/2017  . Back pain   . BPH (benign prostatic hyperplasia)   . Cancer ([place])    skin  . GERD (gastroesophageal reflux disease)   . Hypertension   . Neuropathy   . Pseudophakia   . Pulmonary nodule   . Renal  disorder    kidney stone    Past Surgical History:  Procedure Laterality Date  . BAND HEMORRHOIDECTOMY    . CARDIAC CATHETERIZATION    . HEMORRHOID SURGERY       reports that he has never smoked. He has never used smokeless tobacco. He reports that he does not drink alcohol or use drugs.  Allergies  Allergen Reactions  . Coumadin [Warfarin Sodium] Hives  . Hydrocodone-Acetaminophen Itching  . Oxycontin [Oxycodone Hcl] Itching  . Xarelto [Rivaroxaban] Hives    No family history on file.   Prior to Admission medications   Medication Sig Start Date End Date Taking? Authorizing Provider  Apixaban (ELIQUIS PO) Take 5 mg by mouth 2 (two) times daily.   Yes [provider]  cephALEXin (KEFLEX) 500 MG capsule Take 500 mg by mouth 2 (two) times daily.   Yes [provider]  diltiazem (DILACOR XR) 180 MG 24 hr capsule Take 180 mg by mouth daily.   Yes [provider]  Ergocalciferol (VITAMIN D2) 2000 units TABS Take 1 tablet by mouth daily.   Yes [provider]  gabapentin (NEURONTIN) 300 MG capsule Take 1 capsule by mouth 2 (two) times daily. 02/23/17  Yes [provider]  ibuprofen (ADVIL,MOTRIN) 800 MG tablet Take 800 mg by mouth every 8 (eight) hours as needed for mild pain.    Yes [provider]  LORazepam (ATIVAN) 1 MG tablet Take  1 mg by mouth daily as needed for anxiety.   Yes [provider]  Magnesium 250 MG TABS Take 1 tablet by mouth daily.   Yes [provider]  metoprolol succinate (TOPROL-XL) 25 MG 24 hr tablet Take 25 mg by mouth daily.   Yes [provider]  Multiple Vitamins-Minerals (CENTRUM SILVER PO) Take 1 tablet by mouth daily.   Yes [provider]  Vitamins-Lipotropics (SUPER B-50 COMPLEX PLUS PO) Take 1 tablet by mouth daily.   Yes [provider]  azelastine (ASTELIN) 0.1 % nasal spray Place 1 spray into both nostrils 2 (two) times daily. Use in each nostril as  directed Patient not taking: Reported on 06/11/2017 12/26/16   Tanda Rockers, MD  Azelastine-Fluticasone [facility]) 137-50 MCG/ACT SUSP Place 1 spray into the nose 2 (two) times daily. Patient not taking: Reported on 06/11/2017 12/26/16   Tanda Rockers, MD  fluticasone [facility]) 50 MCG/ACT nasal spray Place 1 spray into both nostrils 2 (two) times daily. Patient not taking: Reported on 06/11/2017 12/26/16   Tanda Rockers, MD    Physical Exam: Vitals:   06/11/17 0340 06/11/17 0553 06/11/17 0903 06/11/17 0908  BP: (!) 146/91 126/88 132/86   Pulse: 99 94 85   Resp: 19 16 17    Temp: 97.7 F (36.5 C)  98 F (36.7 C)   TempSrc: Oral     SpO2: 95% 96% (!) 83% 95%    Constitutional: NAD, calm, comfortable Vitals:   06/11/17 0340 06/11/17 0553 06/11/17 0903 06/11/17 0908  BP: (!) 146/91 126/88 132/86   Pulse: 99 94 85   Resp: 19 16 17    Temp: 97.7 F (36.5 C)  98 F (36.7 C)   TempSrc: Oral     SpO2: 95% 96% (!) 83% 95%   Eyes: Anicteric sclera ENMT: Moist mucous membranes, good dentition Neck: normal, supple Respiratory: Clear to auscultation bilaterally no wheezes, rhonchi, rales Cardiovascular: Irregularly irregular, no murmurs Abdomen: Soft, mildly tender to palpation in bandlike area of left lower quadrant all the way to back, no rebound or guarding, plus bowel sounds Musculoskeletal: No lower extremity edema, negative straight leg test bilaterally, tender to palpation over left back, no point tenderness Skin: no rashes on visible skin Neurologic: CN 2-12 grossly intact. Sensation intact, DTR normal. Strength 5/5 in all 4.  Psychiatric: Normal judgment and insight. Alert and oriented x 3. Normal mood.    Labs on Admission: I have personally reviewed following labs and imaging studies  CBC: Recent Labs  Lab 06/11/17 0340  WBC 5.2  HGB 16.1  HCT 46.1  MCV 88.0  PLT 109   Basic Metabolic Panel: Recent Labs  Lab 06/11/17 0340  NA 139  K 3.9  CL 105  CO2 24    GLUCOSE 122*  BUN 15  CREATININE 0.77  CALCIUM 8.9   GFR: Estimated Creatinine Clearance: 75.1 mL/min (by C-G formula based on SCr of 0.77 mg/dL). Liver Function Tests: Recent Labs  Lab 06/11/17 0340  AST 17  ALT 12*  ALKPHOS 85  BILITOT 0.6  PROT 6.6  ALBUMIN 4.2   Recent Labs  Lab 06/11/17 0340  LIPASE 25   No results for input(s): AMMONIA in the last 168 hours. Coagulation Profile: No results for input(s): INR, PROTIME in the last 168 hours. Cardiac Enzymes: No results for input(s): CKTOTAL, CKMB, CKMBINDEX, TROPONINI in the last 168 hours. BNP (last 3 results) Recent Labs    11/28/16 1656  PROBNP 143.0*   HbA1C:  No results for input(s): HGBA1C in the last 72 hours. CBG: No results for input(s): GLUCAP in the last 168 hours. Lipid Profile: No results for input(s): CHOL, HDL, LDLCALC, TRIG, CHOLHDL, LDLDIRECT in the last 72 hours. Thyroid Function Tests: No results for input(s): TSH, T4TOTAL, FREET4, T3FREE, THYROIDAB in the last 72 hours. Anemia Panel: No results for input(s): VITAMINB12, FOLATE, FERRITIN, TIBC, IRON, RETICCTPCT in the last 72 hours. Urine analysis:    Component Value Date/Time   COLORURINE YELLOW 06/11/2017 0401   APPEARANCEUR CLEAR 06/11/2017 0401   LABSPEC 1.020 06/11/2017 0401   PHURINE 6.5 06/11/2017 0401   GLUCOSEU NEGATIVE 06/11/2017 0401   HGBUR NEGATIVE 06/11/2017 0401   BILIRUBINUR NEGATIVE 06/11/2017 0401   KETONESUR NEGATIVE 06/11/2017 0401   PROTEINUR NEGATIVE 06/11/2017 0401   NITRITE NEGATIVE 06/11/2017 0401   LEUKOCYTESUR NEGATIVE 06/11/2017 0401    Radiological Exams on Admission: Ct Abdomen Pelvis W Contrast  Result Date: 06/11/2017 CLINICAL DATA:  84 y/o M; left flank and left lower quadrant abdominal pain for 2 weeks. EXAM: CT ABDOMEN AND PELVIS WITH CONTRAST TECHNIQUE: Multidetector CT imaging of the abdomen and pelvis was performed using the standard protocol following bolus administration of intravenous  contrast. CONTRAST:  152mL ISOVUE-300 IOPAMIDOL (ISOVUE-300) INJECTION 61% COMPARISON:  05/30/2017 CT abdomen and pelvis. FINDINGS: Lower chest: Ascending thoracic aorta measures up to 3.8 cm and main pulmonary artery 3.3 cm. Hepatobiliary: Hepatic steatosis. Stable 8 mm cyst in liver segment 2. No other focal liver lesion identified. Normal gallbladder. No biliary ductal dilatation. Pancreas: Unremarkable. No pancreatic ductal dilatation or surrounding inflammatory changes. Spleen: Normal in size without focal abnormality. Adrenals/Urinary Tract: Normal adrenal glands. Multiple bilateral renal cysts measuring up to 3.6 cm. No hydronephrosis. Mild stable bladder wall thickening. Stomach/Bowel: Stomach is within normal limits. Appendix appears normal. No evidence of bowel wall thickening, distention, or inflammatory changes. Vascular/Lymphatic: Aortic atherosclerosis. No enlarged abdominal or pelvic lymph nodes. Reproductive: Dense prostate calcification. Other: No abdominal wall hernia or abnormality. No abdominopelvic ascites. Musculoskeletal: No fracture is seen. Lumbar spine degenerative changes with prominent lower lumbar facet arthrosis. IMPRESSION: 1. No acute process identified. 2. Stable bladder wall thickening, possibly due to underdistention or chronic outflow obstruction. 3. Hepatic steatosis. 4. Aortic atherosclerosis. Electronically Signed   By: Kristine Garbe M.D.   On: 06/11/2017 05:01    EKG: Independently reviewed.  None done  Assessment/Plan Active Problems:   Upper airway cough syndrome   OSA (obstructive sleep apnea)   Left lower quadrant pain   Low back pain   Atrial fibrillation, chronic (HCC)   #) Back pain/left lower quadrant pain: Unclear etiology however appears to be primarily related to his long-standing chronic low back pain.  Review of care everywhere patient has not had a recent MRI. -Lumbar MRI -P.o. oxycodone for pain control -IV hydromorphone for  breakthrough pain -Physical therapy consult - Increase gabapentin to 300 mg 3 times a day   #) Chronic atrial fibrillation: -Continue apixaban 5 mg twice daily -Continue diltiazem 180 mg daily -Continue metoprolol succinate 25 mg daily  #) Anxiety/sleep: -Continue PRN lorazepam  Fluids: Tolerating p.o. Electrodes: Monitor and supplement Nutrition: Heart healthy diet  Prophylaxis: Enoxaparin  Disposition: Pending improvement of back pain and tolerating oral pain medication  Full code   Cristy Folks MD [facility]   If 7PM-7AM, please contact night-coverage [email or web address] Password [facility]  06/11/2017, 10:21 AM

## 2017-06-11 NOTE — ED Triage Notes (Signed)
Pain in the left flank that started yesterday evening that has moved to the left lower abdomen tonight. No n/v/d or fevers.

## 2017-06-11 NOTE — ED Notes (Signed)
ED Provider at bedside. 

## 2017-06-11 NOTE — H&P (Addendum)
84 yo M with LLQ abd pain onset earlier tonight.  2 weeks ago had similar presentation, treated for UTI which cleared up.  Now back with LLQ pain but no UTI.  CT abd/pelvis negative other than bladder wall thickening.  Family and EDP request obs.  Will send to med-surg obs, consider GI consult on arrival.  Call flow manager at [phone] when patient arrives.

## 2017-06-12 DIAGNOSIS — I482 Chronic atrial fibrillation: Secondary | ICD-10-CM

## 2017-06-12 DIAGNOSIS — M545 Low back pain: Secondary | ICD-10-CM

## 2017-06-12 DIAGNOSIS — G8929 Other chronic pain: Secondary | ICD-10-CM | POA: Diagnosis not present

## 2017-06-12 NOTE — Discharge Instructions (Signed)
Please get your medications reviewed and adjusted by your Primary MD. ° °Please request your Primary MD to go over all Hospital Tests and Procedure/Radiological results at the follow up, please get all Hospital records sent to your Prim MD by signing hospital release before you go home. ° °If you had Pneumonia of Lung problems at the Hospital: °Please get a 2 view Chest X ray done in 6-8 weeks after hospital discharge or sooner if instructed by your Primary MD. ° °If you have Congestive Heart Failure: °Please call your Cardiologist or Primary MD anytime you have any of the following symptoms:  °1) 3 pound weight gain in 24 hours or 5 pounds in 1 week  °2) shortness of breath, with or without a dry hacking cough  °3) swelling in the hands, feet or stomach  °4) if you have to sleep on extra pillows at night in order to breathe ° °Follow cardiac low salt diet and 1.5 lit/day fluid restriction. ° °If you have diabetes °Accuchecks 4 times/day, Once in AM empty stomach and then before each meal. °Log in all results and show them to your primary doctor at your next visit. °If any glucose reading is under 80 or above 300 call your primary MD immediately. ° °If you have Seizure/Convulsions/Epilepsy: °Please do not drive, operate heavy machinery, participate in activities at heights or participate in high speed sports until you have seen by Primary MD or a Neurologist and advised to do so again. ° °If you had Gastrointestinal Bleeding: °Please ask your Primary MD to check a complete blood count within one week of discharge or at your next visit. Your endoscopic/colonoscopic biopsies that are pending at the time of discharge, will also need to followed by your Primary MD. ° °Get Medicines reviewed and adjusted. °Please take all your medications with you for your next visit with your Primary MD ° °Please request your Primary MD to go over all hospital tests and procedure/radiological results at the follow up, please ask your  Primary MD to get all Hospital records sent to his/her office. ° °If you experience worsening of your admission symptoms, develop shortness of breath, life threatening emergency, suicidal or homicidal thoughts you must seek medical attention immediately by calling 911 or calling your MD immediately  if symptoms less severe. ° °You must read complete instructions/literature along with all the possible adverse reactions/side effects for all the Medicines you take and that have been prescribed to you. Take any new Medicines after you have completely understood and accpet all the possible adverse reactions/side effects.  ° °Do not drive or operate heavy machinery when taking Pain medications.  ° °Do not take more than prescribed Pain, Sleep and Anxiety Medications ° °Special Instructions: If you have smoked or chewed Tobacco  in the last 2 yrs please stop smoking, stop any regular Alcohol  and or any Recreational drug use. ° °Wear Seat belts while driving. ° °Please note °You were cared for by a hospitalist during your hospital stay. If you have any questions about your discharge medications or the care you received while you were in the hospital after you are discharged, you can call the unit and asked to speak with the hospitalist on call if the hospitalist that took care of you is not available. Once you are discharged, your primary care physician will handle any further medical issues. Please note that NO REFILLS for any discharge medications will be authorized once you are discharged, as it is imperative that you   return to your primary care physician (or establish a relationship with a primary care physician if you do not have one) for your aftercare needs so that they can reassess your need for medications and monitor your lab values.  You can reach the hospitalist office at phone [phone] or fax [phone]   If you do not have a primary care physician, you can call [phone] for a physician  referral.   Back Pain, Adult Many adults have back pain from time to time. Common causes of back pain include:  A strained muscle or ligament.  Wear and tear (degeneration) of the spinal disks.  Arthritis.  A hit to the back.  Back pain can be short-lived (acute) or last a long time (chronic). A physical exam, lab tests, and imaging studies may be done to find the cause of your pain. Follow these instructions at home: Managing pain and stiffness  Take over-the-counter and prescription medicines only as told by your health care provider.  If directed, apply heat to the affected area as often as told by your health care provider. Use the heat source that your health care provider recommends, such as a moist heat pack or a heating pad. ? Place a towel between your skin and the heat source. ? Leave the heat on for 20-30 minutes. ? Remove the heat if your skin turns bright red. This is especially important if you are unable to feel pain, heat, or cold. You have a greater risk of getting burned.  If directed, apply ice to the injured area: ? Put ice in a plastic bag. ? Place a towel between your skin and the bag. ? Leave the ice on for 20 minutes, 2-3 times a day for the first 2-3 days. Activity  Do not stay in bed. Resting more than 1-2 days can delay your recovery.  Take short walks on even surfaces as soon as you are able. Try to increase the length of time you walk each day.  Do not sit, drive, or stand in one place for more than 30 minutes at a time. Sitting or standing for long periods of time can put stress on your back.  Use proper lifting techniques. When you bend and lift, use positions that put less stress on your back: ? [place] your knees. ? Keep the load close to your body. ? Avoid twisting.  Exercise regularly as told by your health care provider. Exercising will help your back heal faster. This also helps prevent back injuries by keeping muscles strong and  flexible.  Your health care provider may recommend that you see a physical therapist. This person can help you come up with a safe exercise program. Do any exercises as told by your physical therapist. Lifestyle  Maintain a healthy weight. Extra weight puts stress on your back and makes it difficult to have good posture.  Avoid activities or situations that make you feel anxious or stressed. Learn ways to manage anxiety and stress. One way to manage stress is through exercise. Stress and anxiety increase muscle tension and can make back pain worse. General instructions  Sleep on a firm mattress in a comfortable position. Try lying on your side with your knees slightly bent. If you lie on your back, put a pillow under your knees.  Follow your treatment plan as told by your health care provider. This may include: ? Cognitive or behavioral therapy. ? Acupuncture or massage therapy. ? Meditation or yoga. Contact a health care provider if:  You have pain that is not relieved with rest or medicine.  You have increasing pain going down into your legs or buttocks.  Your pain does not improve in 2 weeks.  You have pain at night.  You lose weight.  You have a fever or chills. Get help right away if:  You develop new bowel or bladder control problems.  You have unusual weakness or numbness in your arms or legs.  You develop nausea or vomiting.  You develop abdominal pain.  You feel faint. Summary  Many adults have back pain from time to time. A physical exam, lab tests, and imaging studies may be done to find the cause of your pain.  Use proper lifting techniques. When you bend and lift, use positions that put less stress on your back.  Take over-the-counter and prescription medicines and apply heat or ice as directed by your health care provider. This information is not intended to replace advice given to you by your health care provider. Make sure you discuss any questions you  have with your health care provider. Document Released: 01/13/2005 Document Revised: 02/18/2016 Document Reviewed: 02/18/2016 Elsevier Interactive Patient Education  [employer].

## 2017-06-12 NOTE — Discharge Summary (Signed)
Physician Discharge Summary  Jonathan Gordon WNU:[id number] DOB: [birth year]  PCP: Myer Peer, MD  Admit date: 06/11/2017 Discharge date: 06/12/2017  Recommendations for Outpatient Follow-up:  1. Dr. Ann Held, PCP in 1 week.  Home Health: None Equipment/Devices: None  Discharge Condition: Improved and stable CODE STATUS: Full Diet recommendation: Heart healthy diet.  Discharge Diagnoses:  Active Problems:   Upper airway cough syndrome   OSA (obstructive sleep apnea)   Left lower quadrant pain   Low back pain   Atrial fibrillation, chronic (HCC)   Brief Summary: 84 year old male, independent, PMH of chronic atrial fibrillation on apixaban, chronic low back pain, BPH, GERD, HTN presented to ED with acute on chronic left-sided low back pain and left lower quadrant abdominal pain.  He was in his usual state of health until day prior to admission when he was assisting his neighbor with starting a lawnmower and had to pull several times and feels that he might have strained his lower back and started having back pain similar to prior flareups for which he had received steroid injections.  He denied lower extremity tingling, numbness, weakness or sphincter disturbances.  He reportedly had an MRI 3 months prior.  He took 2 tablets of his wife's hydrocodone but then noted that the back pain started wrapping around to the left lower quadrant, sharp, intermittent and waxing and waning.  The pain became incapacitating.  He was seen at [facility] ED where CT abdomen showed no acute process, given IV pain medications with minimal improvement and sent to hospital for overnight admission and management.  Assessment and plan:  1. Acute on chronic low back pain: No sciatica.  CT abdomen and pelvis with contrast without acute findings.  MRI of the lumbar spine showed mild lumbar spine spondylosis.  No focal neurological deficits.  Likely related to muscle sprain from physical exertion (indicated above)  complicating underlying chronic low back pain.  He was observed overnight and treated with pain medicines (has not required much since admission) and lidocaine patch with significant improvement.  He states that his acute pain has resolved and is back to his baseline chronic mild low back pain.  He has ambulated comfortably in his room and has even showered by himself.  He reported that he ambulated comfortably yesterday afternoon in the halls.  Advised to avoid physical activity that would exacerbate his low back pain and he verbalized understanding.  Discussed regarding lidocaine patch at discharge, he prefers no patch at discharge due to financial concerns but is aware to get lidocaine 4% patch OTC. 2. Chronic atrial fibrillation: Continue prior home dose of apixaban, diltiazem, metoprolol.  Stable. 3. Essential hypertension: Controlled.  Continue diltiazem and metoprolol. 4. Anxiety: Stable.  Continue home dose of Ativan. 5. BPH: CT abdomen and pelvis shows stable bladder wall thickening.  Patient denies symptoms suggestive of urinary retention.  He denies dysuria, urinary frequency, fevers or chills.  May consider outpatient urology consultation.   Consultations:  None  Procedures:  None   Discharge Instructions  Discharge Instructions    Call MD for:  severe uncontrolled pain   Complete by:  As directed    Diet - low sodium heart healthy   Complete by:  As directed    Increase activity slowly   Complete by:  As directed        Medication List    STOP taking these medications   azelastine 0.1 % nasal spray Commonly known as:  ASTELIN   Azelastine-Fluticasone 137-50 MCG/ACT  Susp Commonly known as:  DYMISTA   fluticasone 50 MCG/ACT nasal spray Commonly known as:  FLONASE     TAKE these medications   apixaban 5 MG Tabs tablet Commonly known as:  ELIQUIS Take 5 mg by mouth 2 (two) times daily.   CENTRUM SILVER PO Take 1 tablet by mouth daily.   cephALEXin 500 MG  capsule Commonly known as:  KEFLEX Take 500 mg by mouth 2 (two) times daily.   diltiazem 180 MG 24 hr capsule Commonly known as:  DILACOR XR Take 180 mg by mouth daily.   gabapentin 300 MG capsule Commonly known as:  NEURONTIN Take 900 mg by mouth at bedtime.   ibuprofen 800 MG tablet Commonly known as:  ADVIL,MOTRIN Take 800 mg by mouth every 8 (eight) hours as needed for mild pain.   LORazepam 1 MG tablet Commonly known as:  ATIVAN Take 1 mg by mouth daily as needed for anxiety.   Magnesium 250 MG Tabs Take 250 mg by mouth daily.   metoprolol succinate 25 MG 24 hr tablet Commonly known as:  TOPROL-XL Take 12.5 mg by mouth daily.   SUPER B-50 COMPLEX PLUS PO Take 1 tablet by mouth daily.   Vitamin D2 2000 units Tabs Take 2,000 Units by mouth daily.      Follow-up Information    Myer Peer, MD. Schedule an appointment as soon as possible for a visit in 1 week(s).   Specialty:  Family Medicine Contact information: [place] [address] [phone]          Allergies  Allergen Reactions  . Hydromorphone Itching  . Coumadin [Warfarin Sodium] Hives  . Hydrocodone-Acetaminophen Itching  . Oxycontin [Oxycodone Hcl] Itching  . Xarelto [Rivaroxaban] Hives      Procedures/Studies: Mr Lumbar Spine Wo Contrast  Result Date: 06/11/2017 CLINICAL DATA:  Pain in the left flank that started yesterday evening that has moved to the left lower abdomen tonight. EXAM: MRI LUMBAR SPINE WITHOUT CONTRAST TECHNIQUE: Multiplanar, multisequence MR imaging of the lumbar spine was performed. No intravenous contrast was administered. COMPARISON:  None. FINDINGS: Segmentation:  Standard. Alignment:  Physiologic. Vertebrae:  No fracture, evidence of discitis, or bone lesion. Conus medullaris and cauda equina: Conus extends to the T12-L1 level. Conus and cauda equina appear normal. Paraspinal and other soft tissues: No acute paraspinal abnormality. Bilateral renal  cysts. Disc levels: Disc spaces: Disc desiccation throughout the lumbar spine. T12-L1: No significant disc bulge. No evidence of neural foraminal stenosis. No central canal stenosis. L1-L2: Mild broad-based disc bulge. Mild bilateral facet arthropathy. No evidence of neural foraminal stenosis. No central canal stenosis. L2-L3: Mild broad-based disc bulge eccentric towards the right. Mild bilateral facet arthropathy. Right lateral recess stenosis. No evidence of neural foraminal stenosis. No central canal stenosis. L3-L4: Broad-based disc bulge. Mild bilateral facet arthropathy. No evidence of neural foraminal stenosis. No central canal stenosis. L4-L5: Broad-based disc bulge flattening the ventral thecal sac. Moderate bilateral facet arthropathy. Bilateral lateral recess stenosis. No evidence of neural foraminal stenosis. No central canal stenosis. L5-S1: Broad-based disc bulge. Severe bilateral facet arthropathy. No evidence of neural foraminal stenosis. No central canal stenosis. IMPRESSION: 1. Mild lumbar spine spondylosis as described above. Electronically Signed   By: Kathreen Devoid   On: 06/11/2017 12:45   Ct Abdomen Pelvis W Contrast  Result Date: 06/11/2017 CLINICAL DATA:  84 y/o M; left flank and left lower quadrant abdominal pain for 2 weeks. EXAM: CT ABDOMEN AND PELVIS WITH CONTRAST  TECHNIQUE: Multidetector CT imaging of the abdomen and pelvis was performed using the standard protocol following bolus administration of intravenous contrast. CONTRAST:  176mL ISOVUE-300 IOPAMIDOL (ISOVUE-300) INJECTION 61% COMPARISON:  05/30/2017 CT abdomen and pelvis. FINDINGS: Lower chest: Ascending thoracic aorta measures up to 3.8 cm and main pulmonary artery 3.3 cm. Hepatobiliary: Hepatic steatosis. Stable 8 mm cyst in liver segment 2. No other focal liver lesion identified. Normal gallbladder. No biliary ductal dilatation. Pancreas: Unremarkable. No pancreatic ductal dilatation or surrounding inflammatory changes.  Spleen: Normal in size without focal abnormality. Adrenals/Urinary Tract: Normal adrenal glands. Multiple bilateral renal cysts measuring up to 3.6 cm. No hydronephrosis. Mild stable bladder wall thickening. Stomach/Bowel: Stomach is within normal limits. Appendix appears normal. No evidence of bowel wall thickening, distention, or inflammatory changes. Vascular/Lymphatic: Aortic atherosclerosis. No enlarged abdominal or pelvic lymph nodes. Reproductive: Dense prostate calcification. Other: No abdominal wall hernia or abnormality. No abdominopelvic ascites. Musculoskeletal: No fracture is seen. Lumbar spine degenerative changes with prominent lower lumbar facet arthrosis. IMPRESSION: 1. No acute process identified. 2. Stable bladder wall thickening, possibly due to underdistention or chronic outflow obstruction. 3. Hepatic steatosis. 4. Aortic atherosclerosis. Electronically Signed   By: Kristine Garbe M.D.   On: 06/11/2017 05:01     Subjective: Patient feels much better.  Acute low back pain has resolved.  No further abdominal pain.  Indicates that his chronic low back pain is at baseline.  Denies tingling, numbness or weakness of lower extremities.  No difficulty urinating, dysuria or urinary frequency.  Had normal BM yesterday.  Ambulated in the room, had a shower this morning and stated that he walked a couple times in the hall yesterday.  As per RN, no acute issues reported.  Discharge Exam:  Vitals:   06/11/17 1329 06/11/17 2138 06/12/17 0528 06/12/17 0925  BP: 124/85 (!) 100/46 93/60 109/80  Pulse: 84 70 67 82  Resp: 16 18 18 16   Temp: 97.9 F (36.6 C) 98.1 F (36.7 C) 97.8 F (36.6 C) 98 F (36.7 C)  TempSrc: Oral Oral Oral Oral  SpO2: 92% 95% 94% 94%  Weight:      Height:        General: Pleasant elderly male, moderately built and nourished, sitting up comfortably in bed this morning. Cardiovascular: S1 & S2 heard, irregularly irregular, S1/S2 +. No murmurs, rubs, gallops  or clicks. No JVD or pedal edema. Respiratory: Clear to auscultation without wheezing, rhonchi or crackles. No increased work of breathing. Abdominal:  Non distended, non tender & soft. No organomegaly or masses appreciated. Normal bowel sounds heard. CNS: Alert and oriented. No focal deficits. Extremities: no edema, no cyanosis.  Symmetric 5 x 5 power.    The results of significant diagnostics from this hospitalization (including imaging, microbiology, ancillary and laboratory) are listed below for reference.     Labs: CBC: Recent Labs  Lab 06/11/17 0340  WBC 5.2  HGB 16.1  HCT 46.1  MCV 88.0  PLT 626   Basic Metabolic Panel: Recent Labs  Lab 06/11/17 0340  NA 139  K 3.9  CL 105  CO2 24  GLUCOSE 122*  BUN 15  CREATININE 0.77  CALCIUM 8.9   Liver Function Tests: Recent Labs  Lab 06/11/17 0340  AST 17  ALT 12*  ALKPHOS 85  BILITOT 0.6  PROT 6.6  ALBUMIN 4.2    Urinalysis    Component Value Date/Time   COLORURINE YELLOW 06/11/2017 0401   APPEARANCEUR CLEAR 06/11/2017 0401   LABSPEC 1.020 06/11/2017  0401   PHURINE 6.5 06/11/2017 0401   GLUCOSEU NEGATIVE 06/11/2017 0401   HGBUR NEGATIVE 06/11/2017 0401   BILIRUBINUR NEGATIVE 06/11/2017 0401   KETONESUR NEGATIVE 06/11/2017 0401   PROTEINUR NEGATIVE 06/11/2017 0401   NITRITE NEGATIVE 06/11/2017 0401   LEUKOCYTESUR NEGATIVE 06/11/2017 0401      Time coordinating discharge: 30 minutes  SIGNED:  Vernell Leep, MD, FACP, [facility]. [facility] Pager [phone]  If 7PM-7AM, please contact night-coverage [email or web address] Password [email or web address] 06/12/2017, 10:50 AM

## 2017-06-12 NOTE — Progress Notes (Signed)
Discharge summary discussed w pt. Questions answered and pt demonstrated understanding of paperwork.  Pt to be escorted to main lobby.

## 2017-06-12 NOTE — Plan of Care (Signed)
Pt alert and oriented, resting this am with minimal complaints of abd/back pain. States he "feels much better today. The lidocaine patch really helped."  RN will monitor.

## 2017-06-15 DIAGNOSIS — D049 Carcinoma in situ of skin, unspecified: Secondary | ICD-10-CM | POA: Diagnosis not present

## 2017-06-15 DIAGNOSIS — D044 Carcinoma in situ of skin of scalp and neck: Secondary | ICD-10-CM | POA: Diagnosis not present

## 2017-06-17 DIAGNOSIS — M47816 Spondylosis without myelopathy or radiculopathy, lumbar region: Secondary | ICD-10-CM | POA: Diagnosis not present

## 2017-06-17 DIAGNOSIS — M519 Unspecified thoracic, thoracolumbar and lumbosacral intervertebral disc disorder: Secondary | ICD-10-CM | POA: Diagnosis not present

## 2017-06-17 DIAGNOSIS — Z6828 Body mass index (BMI) 28.0-28.9, adult: Secondary | ICD-10-CM | POA: Diagnosis not present

## 2017-06-24 DIAGNOSIS — Z4802 Encounter for removal of sutures: Secondary | ICD-10-CM | POA: Diagnosis not present

## 2017-07-01 DIAGNOSIS — J219 Acute bronchiolitis, unspecified: Secondary | ICD-10-CM | POA: Diagnosis not present

## 2017-07-03 DIAGNOSIS — H353112 Nonexudative age-related macular degeneration, right eye, intermediate dry stage: Secondary | ICD-10-CM | POA: Diagnosis not present

## 2017-07-03 DIAGNOSIS — Z961 Presence of intraocular lens: Secondary | ICD-10-CM | POA: Diagnosis not present

## 2017-07-03 DIAGNOSIS — H43812 Vitreous degeneration, left eye: Secondary | ICD-10-CM | POA: Diagnosis not present

## 2017-07-03 DIAGNOSIS — H34812 Central retinal vein occlusion, left eye, with macular edema: Secondary | ICD-10-CM | POA: Diagnosis not present

## 2017-09-02 DIAGNOSIS — G8929 Other chronic pain: Secondary | ICD-10-CM | POA: Diagnosis not present

## 2017-09-02 DIAGNOSIS — M5416 Radiculopathy, lumbar region: Secondary | ICD-10-CM | POA: Diagnosis not present

## 2017-09-02 DIAGNOSIS — M47812 Spondylosis without myelopathy or radiculopathy, cervical region: Secondary | ICD-10-CM | POA: Diagnosis not present

## 2017-09-02 DIAGNOSIS — M542 Cervicalgia: Secondary | ICD-10-CM | POA: Diagnosis not present

## 2017-09-02 DIAGNOSIS — M5442 Lumbago with sciatica, left side: Secondary | ICD-10-CM | POA: Diagnosis not present

## 2017-09-06 DIAGNOSIS — N3091 Cystitis, unspecified with hematuria: Secondary | ICD-10-CM | POA: Diagnosis not present

## 2017-09-06 DIAGNOSIS — M542 Cervicalgia: Secondary | ICD-10-CM | POA: Diagnosis not present

## 2017-09-18 DIAGNOSIS — H34812 Central retinal vein occlusion, left eye, with macular edema: Secondary | ICD-10-CM | POA: Diagnosis not present

## 2017-09-18 DIAGNOSIS — H353112 Nonexudative age-related macular degeneration, right eye, intermediate dry stage: Secondary | ICD-10-CM | POA: Diagnosis not present

## 2017-09-18 DIAGNOSIS — H35352 Cystoid macular degeneration, left eye: Secondary | ICD-10-CM | POA: Diagnosis not present

## 2017-09-18 DIAGNOSIS — H35361 Drusen (degenerative) of macula, right eye: Secondary | ICD-10-CM | POA: Diagnosis not present

## 2017-10-02 DIAGNOSIS — M47812 Spondylosis without myelopathy or radiculopathy, cervical region: Secondary | ICD-10-CM | POA: Diagnosis not present

## 2017-10-09 DIAGNOSIS — M47812 Spondylosis without myelopathy or radiculopathy, cervical region: Secondary | ICD-10-CM | POA: Diagnosis not present

## 2017-10-30 DIAGNOSIS — M47812 Spondylosis without myelopathy or radiculopathy, cervical region: Secondary | ICD-10-CM | POA: Diagnosis not present

## 2017-11-13 DIAGNOSIS — R270 Ataxia, unspecified: Secondary | ICD-10-CM | POA: Diagnosis not present

## 2017-11-13 DIAGNOSIS — G609 Hereditary and idiopathic neuropathy, unspecified: Secondary | ICD-10-CM | POA: Diagnosis not present

## 2017-11-13 DIAGNOSIS — G629 Polyneuropathy, unspecified: Secondary | ICD-10-CM | POA: Diagnosis not present

## 2017-11-13 DIAGNOSIS — M5416 Radiculopathy, lumbar region: Secondary | ICD-10-CM | POA: Diagnosis not present

## 2017-11-17 DIAGNOSIS — M542 Cervicalgia: Secondary | ICD-10-CM | POA: Diagnosis not present

## 2017-11-17 DIAGNOSIS — G8929 Other chronic pain: Secondary | ICD-10-CM | POA: Diagnosis not present

## 2017-11-27 DIAGNOSIS — M7918 Myalgia, other site: Secondary | ICD-10-CM | POA: Diagnosis not present

## 2017-11-27 DIAGNOSIS — M5481 Occipital neuralgia: Secondary | ICD-10-CM | POA: Diagnosis not present

## 2017-11-27 DIAGNOSIS — M542 Cervicalgia: Secondary | ICD-10-CM | POA: Diagnosis not present

## 2017-11-27 DIAGNOSIS — M47812 Spondylosis without myelopathy or radiculopathy, cervical region: Secondary | ICD-10-CM | POA: Diagnosis not present

## 2017-12-01 DIAGNOSIS — J40 Bronchitis, not specified as acute or chronic: Secondary | ICD-10-CM | POA: Diagnosis not present

## 2017-12-01 DIAGNOSIS — M47812 Spondylosis without myelopathy or radiculopathy, cervical region: Secondary | ICD-10-CM | POA: Diagnosis not present

## 2017-12-01 DIAGNOSIS — G4700 Insomnia, unspecified: Secondary | ICD-10-CM | POA: Diagnosis not present

## 2017-12-01 DIAGNOSIS — R799 Abnormal finding of blood chemistry, unspecified: Secondary | ICD-10-CM | POA: Diagnosis not present

## 2017-12-01 DIAGNOSIS — M549 Dorsalgia, unspecified: Secondary | ICD-10-CM | POA: Diagnosis not present

## 2017-12-01 DIAGNOSIS — G629 Polyneuropathy, unspecified: Secondary | ICD-10-CM | POA: Diagnosis not present

## 2017-12-01 DIAGNOSIS — J479 Bronchiectasis, uncomplicated: Secondary | ICD-10-CM | POA: Diagnosis not present

## 2017-12-01 DIAGNOSIS — Z0000 Encounter for general adult medical examination without abnormal findings: Secondary | ICD-10-CM | POA: Diagnosis not present

## 2017-12-01 DIAGNOSIS — I482 Chronic atrial fibrillation, unspecified: Secondary | ICD-10-CM | POA: Diagnosis not present

## 2017-12-01 DIAGNOSIS — I4891 Unspecified atrial fibrillation: Secondary | ICD-10-CM | POA: Diagnosis not present

## 2017-12-01 DIAGNOSIS — R7302 Impaired glucose tolerance (oral): Secondary | ICD-10-CM | POA: Diagnosis not present

## 2017-12-01 DIAGNOSIS — N400 Enlarged prostate without lower urinary tract symptoms: Secondary | ICD-10-CM | POA: Diagnosis not present

## 2017-12-01 DIAGNOSIS — Z125 Encounter for screening for malignant neoplasm of prostate: Secondary | ICD-10-CM | POA: Diagnosis not present

## 2017-12-01 DIAGNOSIS — M25559 Pain in unspecified hip: Secondary | ICD-10-CM | POA: Diagnosis not present

## 2017-12-01 DIAGNOSIS — N401 Enlarged prostate with lower urinary tract symptoms: Secondary | ICD-10-CM | POA: Diagnosis not present

## 2017-12-02 DIAGNOSIS — R5383 Other fatigue: Secondary | ICD-10-CM | POA: Diagnosis not present

## 2017-12-02 DIAGNOSIS — Z1339 Encounter for screening examination for other mental health and behavioral disorders: Secondary | ICD-10-CM | POA: Diagnosis not present

## 2017-12-04 DIAGNOSIS — H34812 Central retinal vein occlusion, left eye, with macular edema: Secondary | ICD-10-CM | POA: Diagnosis not present

## 2017-12-04 DIAGNOSIS — H353112 Nonexudative age-related macular degeneration, right eye, intermediate dry stage: Secondary | ICD-10-CM | POA: Diagnosis not present

## 2017-12-07 DIAGNOSIS — T148XXA Other injury of unspecified body region, initial encounter: Secondary | ICD-10-CM | POA: Diagnosis not present

## 2017-12-07 DIAGNOSIS — Z5181 Encounter for therapeutic drug level monitoring: Secondary | ICD-10-CM | POA: Diagnosis not present

## 2017-12-07 DIAGNOSIS — L928 Other granulomatous disorders of the skin and subcutaneous tissue: Secondary | ICD-10-CM | POA: Diagnosis not present

## 2017-12-07 DIAGNOSIS — Z85828 Personal history of other malignant neoplasm of skin: Secondary | ICD-10-CM | POA: Diagnosis not present

## 2017-12-07 DIAGNOSIS — L570 Actinic keratosis: Secondary | ICD-10-CM | POA: Diagnosis not present

## 2017-12-07 DIAGNOSIS — D485 Neoplasm of uncertain behavior of skin: Secondary | ICD-10-CM | POA: Diagnosis not present

## 2017-12-07 DIAGNOSIS — L821 Other seborrheic keratosis: Secondary | ICD-10-CM | POA: Diagnosis not present

## 2017-12-11 DIAGNOSIS — N3941 Urge incontinence: Secondary | ICD-10-CM | POA: Diagnosis not present

## 2017-12-11 DIAGNOSIS — N401 Enlarged prostate with lower urinary tract symptoms: Secondary | ICD-10-CM | POA: Diagnosis not present

## 2017-12-15 DIAGNOSIS — C4442 Squamous cell carcinoma of skin of scalp and neck: Secondary | ICD-10-CM | POA: Diagnosis not present

## 2017-12-15 DIAGNOSIS — L570 Actinic keratosis: Secondary | ICD-10-CM | POA: Diagnosis not present

## 2018-01-01 DIAGNOSIS — C4442 Squamous cell carcinoma of skin of scalp and neck: Secondary | ICD-10-CM | POA: Diagnosis not present

## 2018-01-08 DIAGNOSIS — M7918 Myalgia, other site: Secondary | ICD-10-CM | POA: Diagnosis not present

## 2018-01-08 DIAGNOSIS — M47812 Spondylosis without myelopathy or radiculopathy, cervical region: Secondary | ICD-10-CM | POA: Diagnosis not present

## 2018-01-08 DIAGNOSIS — M542 Cervicalgia: Secondary | ICD-10-CM | POA: Diagnosis not present

## 2018-01-13 DIAGNOSIS — M542 Cervicalgia: Secondary | ICD-10-CM | POA: Diagnosis not present

## 2018-01-15 DIAGNOSIS — L905 Scar conditions and fibrosis of skin: Secondary | ICD-10-CM | POA: Diagnosis not present

## 2018-01-15 DIAGNOSIS — L929 Granulomatous disorder of the skin and subcutaneous tissue, unspecified: Secondary | ICD-10-CM | POA: Diagnosis not present

## 2018-01-15 DIAGNOSIS — L928 Other granulomatous disorders of the skin and subcutaneous tissue: Secondary | ICD-10-CM | POA: Diagnosis not present

## 2018-01-15 DIAGNOSIS — W57XXXA Bitten or stung by nonvenomous insect and other nonvenomous arthropods, initial encounter: Secondary | ICD-10-CM | POA: Diagnosis not present

## 2018-01-15 DIAGNOSIS — S70361A Insect bite (nonvenomous), right thigh, initial encounter: Secondary | ICD-10-CM | POA: Diagnosis not present

## 2018-01-15 DIAGNOSIS — L720 Epidermal cyst: Secondary | ICD-10-CM | POA: Diagnosis not present

## 2018-01-18 DIAGNOSIS — M542 Cervicalgia: Secondary | ICD-10-CM | POA: Diagnosis not present

## 2018-01-22 DIAGNOSIS — M542 Cervicalgia: Secondary | ICD-10-CM | POA: Diagnosis not present

## 2018-01-22 DIAGNOSIS — Z4802 Encounter for removal of sutures: Secondary | ICD-10-CM | POA: Diagnosis not present

## 2018-01-25 DIAGNOSIS — R69 Illness, unspecified: Secondary | ICD-10-CM | POA: Diagnosis not present

## 2018-01-26 DIAGNOSIS — M542 Cervicalgia: Secondary | ICD-10-CM | POA: Diagnosis not present

## 2018-01-29 DIAGNOSIS — Z885 Allergy status to narcotic agent status: Secondary | ICD-10-CM | POA: Diagnosis not present

## 2018-01-29 DIAGNOSIS — G8929 Other chronic pain: Secondary | ICD-10-CM | POA: Diagnosis not present

## 2018-01-29 DIAGNOSIS — Z886 Allergy status to analgesic agent status: Secondary | ICD-10-CM | POA: Diagnosis not present

## 2018-01-29 DIAGNOSIS — M542 Cervicalgia: Secondary | ICD-10-CM | POA: Diagnosis not present

## 2018-01-29 DIAGNOSIS — M7918 Myalgia, other site: Secondary | ICD-10-CM | POA: Diagnosis not present

## 2018-01-29 DIAGNOSIS — Z888 Allergy status to other drugs, medicaments and biological substances status: Secondary | ICD-10-CM | POA: Diagnosis not present

## 2018-01-29 DIAGNOSIS — M47812 Spondylosis without myelopathy or radiculopathy, cervical region: Secondary | ICD-10-CM | POA: Diagnosis not present

## 2018-02-01 DIAGNOSIS — M542 Cervicalgia: Secondary | ICD-10-CM | POA: Diagnosis not present

## 2018-02-03 DIAGNOSIS — M542 Cervicalgia: Secondary | ICD-10-CM | POA: Diagnosis not present

## 2018-02-04 DIAGNOSIS — Z48810 Encounter for surgical aftercare following surgery on the sense organs: Secondary | ICD-10-CM | POA: Diagnosis not present

## 2018-02-05 DIAGNOSIS — H34812 Central retinal vein occlusion, left eye, with macular edema: Secondary | ICD-10-CM | POA: Diagnosis not present

## 2018-02-05 DIAGNOSIS — H353112 Nonexudative age-related macular degeneration, right eye, intermediate dry stage: Secondary | ICD-10-CM | POA: Diagnosis not present

## 2018-02-08 DIAGNOSIS — R270 Ataxia, unspecified: Secondary | ICD-10-CM | POA: Diagnosis not present

## 2018-02-08 DIAGNOSIS — G609 Hereditary and idiopathic neuropathy, unspecified: Secondary | ICD-10-CM | POA: Diagnosis not present

## 2018-02-08 DIAGNOSIS — M542 Cervicalgia: Secondary | ICD-10-CM | POA: Diagnosis not present

## 2018-02-10 DIAGNOSIS — M542 Cervicalgia: Secondary | ICD-10-CM | POA: Diagnosis not present

## 2018-02-10 DIAGNOSIS — M47812 Spondylosis without myelopathy or radiculopathy, cervical region: Secondary | ICD-10-CM | POA: Diagnosis not present

## 2018-02-12 DIAGNOSIS — F4320 Adjustment disorder, unspecified: Secondary | ICD-10-CM | POA: Diagnosis not present

## 2018-02-22 DIAGNOSIS — H34812 Central retinal vein occlusion, left eye, with macular edema: Secondary | ICD-10-CM | POA: Diagnosis not present

## 2018-02-24 DIAGNOSIS — M47812 Spondylosis without myelopathy or radiculopathy, cervical region: Secondary | ICD-10-CM | POA: Diagnosis not present

## 2018-02-24 DIAGNOSIS — M47816 Spondylosis without myelopathy or radiculopathy, lumbar region: Secondary | ICD-10-CM | POA: Diagnosis not present

## 2018-02-26 DIAGNOSIS — F4320 Adjustment disorder, unspecified: Secondary | ICD-10-CM | POA: Diagnosis not present

## 2018-02-26 DIAGNOSIS — I10 Essential (primary) hypertension: Secondary | ICD-10-CM | POA: Diagnosis not present

## 2018-02-26 DIAGNOSIS — I482 Chronic atrial fibrillation, unspecified: Secondary | ICD-10-CM | POA: Diagnosis not present

## 2018-03-10 DIAGNOSIS — M47812 Spondylosis without myelopathy or radiculopathy, cervical region: Secondary | ICD-10-CM | POA: Diagnosis not present

## 2018-03-15 DIAGNOSIS — N401 Enlarged prostate with lower urinary tract symptoms: Secondary | ICD-10-CM | POA: Diagnosis not present

## 2018-03-15 DIAGNOSIS — R3915 Urgency of urination: Secondary | ICD-10-CM | POA: Diagnosis not present

## 2018-03-15 DIAGNOSIS — N3941 Urge incontinence: Secondary | ICD-10-CM | POA: Diagnosis not present

## 2018-03-18 DIAGNOSIS — N400 Enlarged prostate without lower urinary tract symptoms: Secondary | ICD-10-CM | POA: Diagnosis not present

## 2018-03-18 DIAGNOSIS — G8929 Other chronic pain: Secondary | ICD-10-CM | POA: Diagnosis not present

## 2018-03-18 DIAGNOSIS — K219 Gastro-esophageal reflux disease without esophagitis: Secondary | ICD-10-CM | POA: Diagnosis not present

## 2018-03-18 DIAGNOSIS — J45909 Unspecified asthma, uncomplicated: Secondary | ICD-10-CM | POA: Diagnosis not present

## 2018-03-18 DIAGNOSIS — M5416 Radiculopathy, lumbar region: Secondary | ICD-10-CM | POA: Diagnosis not present

## 2018-03-18 DIAGNOSIS — G4733 Obstructive sleep apnea (adult) (pediatric): Secondary | ICD-10-CM | POA: Diagnosis not present

## 2018-03-18 DIAGNOSIS — G629 Polyneuropathy, unspecified: Secondary | ICD-10-CM | POA: Diagnosis not present

## 2018-03-18 DIAGNOSIS — Z7901 Long term (current) use of anticoagulants: Secondary | ICD-10-CM | POA: Diagnosis not present

## 2018-03-18 DIAGNOSIS — I4891 Unspecified atrial fibrillation: Secondary | ICD-10-CM | POA: Diagnosis not present

## 2018-03-18 DIAGNOSIS — R51 Headache: Secondary | ICD-10-CM | POA: Diagnosis not present

## 2018-03-18 DIAGNOSIS — I10 Essential (primary) hypertension: Secondary | ICD-10-CM | POA: Diagnosis not present

## 2018-03-18 DIAGNOSIS — M542 Cervicalgia: Secondary | ICD-10-CM | POA: Diagnosis not present

## 2018-03-18 DIAGNOSIS — M25569 Pain in unspecified knee: Secondary | ICD-10-CM | POA: Diagnosis not present

## 2018-03-18 DIAGNOSIS — Z85828 Personal history of other malignant neoplasm of skin: Secondary | ICD-10-CM | POA: Diagnosis not present

## 2018-03-22 DIAGNOSIS — F4320 Adjustment disorder, unspecified: Secondary | ICD-10-CM | POA: Diagnosis not present

## 2018-04-05 DIAGNOSIS — R32 Unspecified urinary incontinence: Secondary | ICD-10-CM | POA: Diagnosis not present

## 2018-04-05 DIAGNOSIS — R51 Headache: Secondary | ICD-10-CM | POA: Diagnosis not present

## 2018-04-05 DIAGNOSIS — I951 Orthostatic hypotension: Secondary | ICD-10-CM | POA: Diagnosis not present

## 2018-04-30 DIAGNOSIS — H34812 Central retinal vein occlusion, left eye, with macular edema: Secondary | ICD-10-CM | POA: Diagnosis not present

## 2018-04-30 DIAGNOSIS — H353112 Nonexudative age-related macular degeneration, right eye, intermediate dry stage: Secondary | ICD-10-CM | POA: Diagnosis not present

## 2018-04-30 DIAGNOSIS — H35352 Cystoid macular degeneration, left eye: Secondary | ICD-10-CM | POA: Diagnosis not present

## 2018-05-26 DIAGNOSIS — N390 Urinary tract infection, site not specified: Secondary | ICD-10-CM | POA: Diagnosis not present

## 2018-05-28 DIAGNOSIS — R42 Dizziness and giddiness: Secondary | ICD-10-CM | POA: Diagnosis not present

## 2018-05-28 DIAGNOSIS — N390 Urinary tract infection, site not specified: Secondary | ICD-10-CM | POA: Diagnosis not present

## 2018-06-01 DIAGNOSIS — N390 Urinary tract infection, site not specified: Secondary | ICD-10-CM | POA: Diagnosis not present

## 2018-06-01 DIAGNOSIS — N3941 Urge incontinence: Secondary | ICD-10-CM | POA: Diagnosis not present

## 2018-06-01 DIAGNOSIS — R42 Dizziness and giddiness: Secondary | ICD-10-CM | POA: Diagnosis not present

## 2018-06-01 DIAGNOSIS — R7302 Impaired glucose tolerance (oral): Secondary | ICD-10-CM | POA: Diagnosis not present

## 2018-06-09 DIAGNOSIS — Z5181 Encounter for therapeutic drug level monitoring: Secondary | ICD-10-CM | POA: Diagnosis not present

## 2018-06-09 DIAGNOSIS — Z85828 Personal history of other malignant neoplasm of skin: Secondary | ICD-10-CM | POA: Diagnosis not present

## 2018-06-09 DIAGNOSIS — L570 Actinic keratosis: Secondary | ICD-10-CM | POA: Diagnosis not present

## 2018-06-09 DIAGNOSIS — L821 Other seborrheic keratosis: Secondary | ICD-10-CM | POA: Diagnosis not present

## 2018-06-09 DIAGNOSIS — Z79899 Other long term (current) drug therapy: Secondary | ICD-10-CM | POA: Diagnosis not present

## 2018-06-14 DIAGNOSIS — Z87442 Personal history of urinary calculi: Secondary | ICD-10-CM | POA: Diagnosis not present

## 2018-06-14 DIAGNOSIS — N401 Enlarged prostate with lower urinary tract symptoms: Secondary | ICD-10-CM | POA: Diagnosis not present

## 2018-06-14 DIAGNOSIS — N3941 Urge incontinence: Secondary | ICD-10-CM | POA: Diagnosis not present

## 2018-07-09 DIAGNOSIS — H353112 Nonexudative age-related macular degeneration, right eye, intermediate dry stage: Secondary | ICD-10-CM | POA: Diagnosis not present

## 2018-07-09 DIAGNOSIS — H34812 Central retinal vein occlusion, left eye, with macular edema: Secondary | ICD-10-CM | POA: Diagnosis not present

## 2018-07-13 DIAGNOSIS — N3941 Urge incontinence: Secondary | ICD-10-CM | POA: Diagnosis not present

## 2018-07-13 DIAGNOSIS — G4700 Insomnia, unspecified: Secondary | ICD-10-CM | POA: Diagnosis not present

## 2018-07-13 DIAGNOSIS — N401 Enlarged prostate with lower urinary tract symptoms: Secondary | ICD-10-CM | POA: Diagnosis not present

## 2018-07-16 DIAGNOSIS — Z87442 Personal history of urinary calculi: Secondary | ICD-10-CM | POA: Diagnosis not present

## 2018-07-16 DIAGNOSIS — N3941 Urge incontinence: Secondary | ICD-10-CM | POA: Diagnosis not present

## 2018-07-16 DIAGNOSIS — N401 Enlarged prostate with lower urinary tract symptoms: Secondary | ICD-10-CM | POA: Diagnosis not present

## 2018-07-16 DIAGNOSIS — R350 Frequency of micturition: Secondary | ICD-10-CM | POA: Diagnosis not present

## 2018-07-16 DIAGNOSIS — Z87438 Personal history of other diseases of male genital organs: Secondary | ICD-10-CM | POA: Diagnosis not present

## 2018-07-16 DIAGNOSIS — R3915 Urgency of urination: Secondary | ICD-10-CM | POA: Diagnosis not present

## 2018-07-16 DIAGNOSIS — N281 Cyst of kidney, acquired: Secondary | ICD-10-CM | POA: Diagnosis not present

## 2018-08-24 DIAGNOSIS — G609 Hereditary and idiopathic neuropathy, unspecified: Secondary | ICD-10-CM | POA: Diagnosis not present

## 2018-08-24 DIAGNOSIS — R270 Ataxia, unspecified: Secondary | ICD-10-CM | POA: Diagnosis not present

## 2018-08-25 DIAGNOSIS — K219 Gastro-esophageal reflux disease without esophagitis: Secondary | ICD-10-CM | POA: Diagnosis not present

## 2018-08-25 DIAGNOSIS — G4733 Obstructive sleep apnea (adult) (pediatric): Secondary | ICD-10-CM | POA: Diagnosis not present

## 2018-08-25 DIAGNOSIS — G8929 Other chronic pain: Secondary | ICD-10-CM | POA: Diagnosis not present

## 2018-08-25 DIAGNOSIS — M25561 Pain in right knee: Secondary | ICD-10-CM | POA: Diagnosis not present

## 2018-08-25 DIAGNOSIS — M25562 Pain in left knee: Secondary | ICD-10-CM | POA: Diagnosis not present

## 2018-08-25 DIAGNOSIS — J45909 Unspecified asthma, uncomplicated: Secondary | ICD-10-CM | POA: Diagnosis not present

## 2018-08-25 DIAGNOSIS — M79604 Pain in right leg: Secondary | ICD-10-CM | POA: Diagnosis not present

## 2018-08-25 DIAGNOSIS — G629 Polyneuropathy, unspecified: Secondary | ICD-10-CM | POA: Diagnosis not present

## 2018-08-25 DIAGNOSIS — Z7901 Long term (current) use of anticoagulants: Secondary | ICD-10-CM | POA: Diagnosis not present

## 2018-08-25 DIAGNOSIS — M47816 Spondylosis without myelopathy or radiculopathy, lumbar region: Secondary | ICD-10-CM | POA: Diagnosis not present

## 2018-08-25 DIAGNOSIS — M79605 Pain in left leg: Secondary | ICD-10-CM | POA: Diagnosis not present

## 2018-08-25 DIAGNOSIS — M5442 Lumbago with sciatica, left side: Secondary | ICD-10-CM | POA: Diagnosis not present

## 2018-08-25 DIAGNOSIS — I4891 Unspecified atrial fibrillation: Secondary | ICD-10-CM | POA: Diagnosis not present

## 2018-08-25 DIAGNOSIS — M47812 Spondylosis without myelopathy or radiculopathy, cervical region: Secondary | ICD-10-CM | POA: Diagnosis not present

## 2018-08-25 DIAGNOSIS — M62838 Other muscle spasm: Secondary | ICD-10-CM | POA: Diagnosis not present

## 2018-08-25 DIAGNOSIS — M1991 Primary osteoarthritis, unspecified site: Secondary | ICD-10-CM | POA: Diagnosis not present

## 2018-08-25 DIAGNOSIS — Z4789 Encounter for other orthopedic aftercare: Secondary | ICD-10-CM | POA: Diagnosis not present

## 2018-08-25 DIAGNOSIS — M542 Cervicalgia: Secondary | ICD-10-CM | POA: Diagnosis not present

## 2018-08-25 DIAGNOSIS — I10 Essential (primary) hypertension: Secondary | ICD-10-CM | POA: Diagnosis not present

## 2018-08-25 DIAGNOSIS — Z79899 Other long term (current) drug therapy: Secondary | ICD-10-CM | POA: Diagnosis not present

## 2018-08-25 DIAGNOSIS — Z85828 Personal history of other malignant neoplasm of skin: Secondary | ICD-10-CM | POA: Diagnosis not present

## 2018-08-25 DIAGNOSIS — Z9889 Other specified postprocedural states: Secondary | ICD-10-CM | POA: Diagnosis not present

## 2018-08-25 DIAGNOSIS — N400 Enlarged prostate without lower urinary tract symptoms: Secondary | ICD-10-CM | POA: Diagnosis not present

## 2018-08-25 DIAGNOSIS — M7918 Myalgia, other site: Secondary | ICD-10-CM | POA: Diagnosis not present

## 2018-08-30 DIAGNOSIS — F4320 Adjustment disorder, unspecified: Secondary | ICD-10-CM | POA: Diagnosis not present

## 2018-09-02 DIAGNOSIS — R29898 Other symptoms and signs involving the musculoskeletal system: Secondary | ICD-10-CM | POA: Diagnosis not present

## 2018-09-02 DIAGNOSIS — M542 Cervicalgia: Secondary | ICD-10-CM | POA: Diagnosis not present

## 2018-09-02 DIAGNOSIS — R262 Difficulty in walking, not elsewhere classified: Secondary | ICD-10-CM | POA: Diagnosis not present

## 2018-09-02 DIAGNOSIS — M629 Disorder of muscle, unspecified: Secondary | ICD-10-CM | POA: Diagnosis not present

## 2018-09-02 DIAGNOSIS — M25562 Pain in left knee: Secondary | ICD-10-CM | POA: Diagnosis not present

## 2018-09-02 DIAGNOSIS — M25561 Pain in right knee: Secondary | ICD-10-CM | POA: Diagnosis not present

## 2018-09-02 DIAGNOSIS — M79605 Pain in left leg: Secondary | ICD-10-CM | POA: Diagnosis not present

## 2018-09-02 DIAGNOSIS — Z789 Other specified health status: Secondary | ICD-10-CM | POA: Diagnosis not present

## 2018-09-02 DIAGNOSIS — M79604 Pain in right leg: Secondary | ICD-10-CM | POA: Diagnosis not present

## 2018-09-02 DIAGNOSIS — G8929 Other chronic pain: Secondary | ICD-10-CM | POA: Diagnosis not present

## 2018-09-08 DIAGNOSIS — G8929 Other chronic pain: Secondary | ICD-10-CM | POA: Diagnosis not present

## 2018-09-08 DIAGNOSIS — Z789 Other specified health status: Secondary | ICD-10-CM | POA: Diagnosis not present

## 2018-09-08 DIAGNOSIS — M25561 Pain in right knee: Secondary | ICD-10-CM | POA: Diagnosis not present

## 2018-09-08 DIAGNOSIS — K5720 Diverticulitis of large intestine with perforation and abscess without bleeding: Secondary | ICD-10-CM | POA: Diagnosis not present

## 2018-09-08 DIAGNOSIS — M629 Disorder of muscle, unspecified: Secondary | ICD-10-CM | POA: Diagnosis not present

## 2018-09-08 DIAGNOSIS — R29898 Other symptoms and signs involving the musculoskeletal system: Secondary | ICD-10-CM | POA: Diagnosis not present

## 2018-09-08 DIAGNOSIS — R262 Difficulty in walking, not elsewhere classified: Secondary | ICD-10-CM | POA: Diagnosis not present

## 2018-09-08 DIAGNOSIS — M25562 Pain in left knee: Secondary | ICD-10-CM | POA: Diagnosis not present

## 2018-09-13 DIAGNOSIS — F4320 Adjustment disorder, unspecified: Secondary | ICD-10-CM | POA: Diagnosis not present

## 2018-09-14 ENCOUNTER — Emergency Department (HOSPITAL_BASED_OUTPATIENT_CLINIC_OR_DEPARTMENT_OTHER): Payer: Medicare Other

## 2018-09-14 ENCOUNTER — Emergency Department (HOSPITAL_BASED_OUTPATIENT_CLINIC_OR_DEPARTMENT_OTHER)
Admission: EM | Admit: 2018-09-14 | Discharge: 2018-09-14 | Disposition: A | Discharge status: Home / Self Care | Payer: Medicare Other | Attending: Emergency Medicine | Admitting: Emergency Medicine

## 2018-09-14 ENCOUNTER — Other Ambulatory Visit: Payer: Self-pay

## 2018-09-14 ENCOUNTER — Encounter (HOSPITAL_BASED_OUTPATIENT_CLINIC_OR_DEPARTMENT_OTHER): Payer: Self-pay

## 2018-09-14 DIAGNOSIS — Z7901 Long term (current) use of anticoagulants: Secondary | ICD-10-CM | POA: Insufficient documentation

## 2018-09-14 DIAGNOSIS — K7689 Other specified diseases of liver: Secondary | ICD-10-CM | POA: Diagnosis not present

## 2018-09-14 DIAGNOSIS — M25562 Pain in left knee: Secondary | ICD-10-CM | POA: Diagnosis not present

## 2018-09-14 DIAGNOSIS — Z79899 Other long term (current) drug therapy: Secondary | ICD-10-CM | POA: Insufficient documentation

## 2018-09-14 DIAGNOSIS — R001 Bradycardia, unspecified: Secondary | ICD-10-CM | POA: Diagnosis not present

## 2018-09-14 DIAGNOSIS — R1032 Left lower quadrant pain: Secondary | ICD-10-CM | POA: Insufficient documentation

## 2018-09-14 DIAGNOSIS — R29898 Other symptoms and signs involving the musculoskeletal system: Secondary | ICD-10-CM | POA: Diagnosis not present

## 2018-09-14 DIAGNOSIS — N400 Enlarged prostate without lower urinary tract symptoms: Secondary | ICD-10-CM | POA: Diagnosis not present

## 2018-09-14 DIAGNOSIS — I700 Atherosclerosis of aorta: Secondary | ICD-10-CM | POA: Diagnosis not present

## 2018-09-14 DIAGNOSIS — I4891 Unspecified atrial fibrillation: Secondary | ICD-10-CM | POA: Insufficient documentation

## 2018-09-14 DIAGNOSIS — Z789 Other specified health status: Secondary | ICD-10-CM | POA: Diagnosis not present

## 2018-09-14 DIAGNOSIS — G8929 Other chronic pain: Secondary | ICD-10-CM | POA: Diagnosis not present

## 2018-09-14 DIAGNOSIS — N281 Cyst of kidney, acquired: Secondary | ICD-10-CM | POA: Diagnosis not present

## 2018-09-14 DIAGNOSIS — R262 Difficulty in walking, not elsewhere classified: Secondary | ICD-10-CM | POA: Diagnosis not present

## 2018-09-14 DIAGNOSIS — M25561 Pain in right knee: Secondary | ICD-10-CM | POA: Diagnosis not present

## 2018-09-14 DIAGNOSIS — M47817 Spondylosis without myelopathy or radiculopathy, lumbosacral region: Secondary | ICD-10-CM | POA: Diagnosis not present

## 2018-09-14 DIAGNOSIS — K5730 Diverticulosis of large intestine without perforation or abscess without bleeding: Secondary | ICD-10-CM | POA: Diagnosis not present

## 2018-09-14 DIAGNOSIS — I10 Essential (primary) hypertension: Secondary | ICD-10-CM | POA: Diagnosis not present

## 2018-09-14 DIAGNOSIS — M629 Disorder of muscle, unspecified: Secondary | ICD-10-CM | POA: Diagnosis not present

## 2018-09-14 HISTORY — DX: Cervicalgia: M54.2

## 2018-09-14 LAB — URINALYSIS, ROUTINE W REFLEX MICROSCOPIC
Bilirubin Urine: NEGATIVE
Glucose, UA: NEGATIVE mg/dL
Hgb urine dipstick: NEGATIVE
Ketones, ur: NEGATIVE mg/dL
Nitrite: NEGATIVE
Protein, ur: NEGATIVE mg/dL
Specific Gravity, Urine: 1.01 (ref 1.005–1.030)
pH: 6 (ref 5.0–8.0)

## 2018-09-14 LAB — URINALYSIS, MICROSCOPIC (REFLEX): RBC / HPF: NONE SEEN RBC/hpf (ref 0–5)

## 2018-09-14 LAB — CBC WITH DIFFERENTIAL/PLATELET
Abs Immature Granulocytes: 0.01 10*3/uL (ref 0.00–0.07)
Basophils Absolute: 0.1 10*3/uL (ref 0.0–0.1)
Basophils Relative: 1 %
Eosinophils Absolute: 0.1 10*3/uL (ref 0.0–0.5)
Eosinophils Relative: 3 %
HCT: 44.8 % (ref 39.0–52.0)
Hemoglobin: 14.8 g/dL (ref 13.0–17.0)
Immature Granulocytes: 0 %
Lymphocytes Relative: 22 %
Lymphs Abs: 1.1 10*3/uL (ref 0.7–4.0)
MCH: 30 pg (ref 26.0–34.0)
MCHC: 33 g/dL (ref 30.0–36.0)
MCV: 90.9 fL (ref 80.0–100.0)
Monocytes Absolute: 0.3 10*3/uL (ref 0.1–1.0)
Monocytes Relative: 6 %
Neutro Abs: 3.5 10*3/uL (ref 1.7–7.7)
Neutrophils Relative %: 68 %
Platelets: 200 10*3/uL (ref 150–400)
RBC: 4.93 MIL/uL (ref 4.22–5.81)
RDW: 13.3 % (ref 11.5–15.5)
WBC: 5.1 10*3/uL (ref 4.0–10.5)
nRBC: 0 % (ref 0.0–0.2)

## 2018-09-14 LAB — COMPREHENSIVE METABOLIC PANEL
ALT: 8 U/L (ref 0–44)
AST: 14 U/L — ABNORMAL LOW (ref 15–41)
Albumin: 3.8 g/dL (ref 3.5–5.0)
Alkaline Phosphatase: 76 U/L (ref 38–126)
Anion gap: 10 (ref 5–15)
BUN: 14 mg/dL (ref 8–23)
CO2: 24 mmol/L (ref 22–32)
Calcium: 8.5 mg/dL — ABNORMAL LOW (ref 8.9–10.3)
Chloride: 104 mmol/L (ref 98–111)
Creatinine, Ser: 0.98 mg/dL (ref 0.61–1.24)
GFR calc Af Amer: >60 mL/min (ref >60)
GFR calc non Af Amer: >60 mL/min (ref >60)
Glucose, Bld: 172 mg/dL — ABNORMAL HIGH (ref 70–99)
Potassium: 3.8 mmol/L (ref 3.5–5.1)
Sodium: 138 mmol/L (ref 135–145)
Total Bilirubin: 0.9 mg/dL (ref 0.3–1.2)
Total Protein: 6.1 g/dL — ABNORMAL LOW (ref 6.5–8.1)

## 2018-09-14 LAB — LIPASE, BLOOD: Lipase: 24 U/L (ref 11–51)

## 2018-09-14 MED ORDER — IOHEXOL 300 MG/ML  SOLN
100.0000 mL | Freq: Once | INTRAMUSCULAR | Status: AC | PRN
  Administered 2018-09-14: 100 mL via INTRAVENOUS

## 2018-09-14 MED ORDER — SODIUM CHLORIDE 0.9 % IV BOLUS
1000.0000 mL | Freq: Once | INTRAVENOUS | Status: AC
  Administered 2018-09-14: 1000 mL via INTRAVENOUS

## 2018-09-14 NOTE — Discharge Instructions (Addendum)
You were seen in the emergency department for abdominal pain.  You had a CAT scan that did not show any signs of acute diverticulitis.  We recommend that you finish your antibiotics and increase fluids and fiber.  Please contact your doctor for close follow-up.  We also noticed that your heart rate was low while you were here and you may consider cutting in half your metoprolol and following what your blood pressure and heart rate are doing.  Please inform your doctor.  Please return to the emergency department if any worsening symptoms.

## 2018-09-14 NOTE — ED Triage Notes (Signed)
Pt c/o left side abd pain x 2 weeks-seen by PCP last week-started on abx for possible diverticulitis-pt NAD-steady gait

## 2018-09-14 NOTE — ED Notes (Signed)
Pt drinking oral contrast w/o difficulty

## 2018-09-14 NOTE — ED Provider Notes (Signed)
[facility] EMERGENCY DEPARTMENT Provider Note   CSN: [id number] Arrival date & time: 09/14/18  1301     History   Chief Complaint Chief Complaint  Patient presents with  . Abdominal Pain    HPI Josua Ferrebee is a 85 y.o. male.  He has a history of A. fib and is on Eliquis.  He is presenting with 2 weeks of left-sided abdominal pain that is been slowly progressive over time and radiated into his back.  He said bumps make the pain worse along with any movement and is better if he remains still.  He saw his PCP and was empirically started on Augmentin which he is taken for 5 days with no improvement in his symptoms.  No fevers or chills no cough no nausea no vomiting.  He had one episode of incontinence of urine few days ago but has not had any since then and no urinary symptoms.     The history is provided by the patient.  Abdominal Pain Pain location:  LUQ and LLQ Pain quality: stabbing   Pain radiates to:  Back and L flank Pain severity:  Moderate Onset quality:  Gradual Duration:  2 weeks Timing:  Constant Progression:  Worsening Chronicity:  New Context: not recent travel, not sick contacts and not trauma   Relieved by:  Lying down Worsened by:  Movement and palpation Ineffective treatments: augmentin. Associated symptoms: no anorexia, no chest pain, no chills, no constipation, no cough, no diarrhea, no dysuria, no fever, no hematemesis, no hematochezia, no hematuria, no melena, no nausea, no shortness of breath, no sore throat and no vomiting     Past Medical History:  Diagnosis Date  . Actinic keratoses   . Arthritis   . Asthma   . Atrial fibrillation ([place])   . Atrial fibrillation, chronic 06/11/2017  . Back pain   . BPH (benign prostatic hyperplasia)   . Cancer ([place])    skin  . GERD (gastroesophageal reflux disease)   . Hypertension   . Neck pain   . Neuropathy   . Pseudophakia   . Pulmonary nodule   . Renal disorder    kidney stone    Patient  Active Problem List   Diagnosis Date Noted  . Left lower quadrant pain 06/11/2017  . Low back pain 06/11/2017  . Atrial fibrillation, chronic 06/11/2017  . OSA (obstructive sleep apnea) 12/26/2016  . Upper airway cough syndrome 11/28/2016  . Dyspnea on exertion 08/26/2016  . Bronchiectasis without complication ([place]) 78/67/6720    Past Surgical History:  Procedure Laterality Date  . BAND HEMORRHOIDECTOMY    . CARDIAC CATHETERIZATION    . HEMORRHOID SURGERY          Home Medications    Prior to Admission medications   Medication Sig Start Date End Date Taking? Authorizing Provider  amoxicillin-clavulanate (AUGMENTIN) 500-125 MG tablet Take by mouth. 09/08/18 09/18/18 Yes [provider]  apixaban (ELIQUIS) 5 MG TABS tablet Take 5 mg by mouth 2 (two) times daily.    [provider]  cephALEXin (KEFLEX) 500 MG capsule Take 500 mg by mouth 2 (two) times daily.    [provider]  diltiazem (DILACOR XR) 180 MG 24 hr capsule Take 180 mg by mouth daily.    [provider]  Ergocalciferol (VITAMIN D2) 2000 units TABS Take 2,000 Units by mouth daily.     [provider]  finasteride (PROSCAR) 5 MG tablet  06/01/18   [provider]  gabapentin (  NEURONTIN) 300 MG capsule Take 900 mg by mouth at bedtime.  02/23/17   [provider]  ibuprofen (ADVIL,MOTRIN) 800 MG tablet Take 800 mg by mouth every 8 (eight) hours as needed for mild pain.     [provider]  LORazepam (ATIVAN) 1 MG tablet Take 1 mg by mouth daily as needed for anxiety.    [provider]  Magnesium 250 MG TABS Take 250 mg by mouth daily.     [provider]  metoprolol succinate (TOPROL-XL) 25 MG 24 hr tablet Take 25 mg by mouth daily. Pt sts dose increased to "a whole pill" 1 yr ago    [provider]  Multiple Vitamins-Minerals (CENTRUM SILVER PO) Take 1 tablet by mouth daily.    [provider]  tamsulosin (FLOMAX) 0.4  MG CAPS capsule  06/01/18   [provider]  Vitamins-Lipotropics (SUPER B-50 COMPLEX PLUS PO) Take 1 tablet by mouth daily.    [provider]    Family History No family history on file.  Social History Social History   Tobacco Use  . Smoking status: Never Smoker  . Smokeless tobacco: Never Used  Substance Use Topics  . Alcohol use: No  . Drug use: No     Allergies   Hydromorphone, Coumadin [warfarin sodium], Hydrocodone-acetaminophen, Oxycontin [oxycodone hcl], and Xarelto [rivaroxaban]   Review of Systems Review of Systems  Constitutional: Negative for chills and fever.  HENT: Negative for sore throat.   Eyes: Negative for visual disturbance.  Respiratory: Negative for cough and shortness of breath.   Cardiovascular: Negative for chest pain.  Gastrointestinal: Positive for abdominal pain. Negative for anorexia, constipation, diarrhea, hematemesis, hematochezia, melena, nausea and vomiting.  Genitourinary: Negative for dysuria and hematuria.  Musculoskeletal: Negative for neck pain.  Skin: Negative for rash.  Neurological: Negative for headaches.     Physical Exam Updated Vital Signs BP (!) 94/59   Pulse (!) 49   Temp 97.7 F (36.5 C) (Oral)   Resp 15   Ht 5\' 8"  (1.727 m)   Wt 84.8 kg   SpO2 95%   BMI 28.43 kg/m   Physical Exam Vitals signs and nursing note reviewed.  Constitutional:      Appearance: He is well-developed.  HENT:     Head: Normocephalic and atraumatic.  Eyes:     Conjunctiva/sclera: Conjunctivae normal.  Neck:     Musculoskeletal: Neck supple.  Cardiovascular:     Rate and Rhythm: Normal rate and regular rhythm.     Heart sounds: No murmur.  Pulmonary:     Effort: Pulmonary effort is normal. No respiratory distress.     Breath sounds: Normal breath sounds.  Abdominal:     Palpations: Abdomen is soft.     Tenderness: There is abdominal tenderness in the left upper quadrant and left lower quadrant. There is no  guarding or rebound.  Musculoskeletal: Normal range of motion.     Right lower leg: No edema.     Left lower leg: No edema.  Skin:    General: Skin is warm and dry.     Capillary Refill: Capillary refill takes less than 2 seconds.  Neurological:     General: No focal deficit present.     Mental Status: He is alert and oriented to person, place, and time.      ED Treatments / Results  Labs (all labs ordered are listed, but only abnormal results are displayed) Labs Reviewed  COMPREHENSIVE METABOLIC PANEL - Abnormal;  Notable for the following components:      Result Value   Glucose, Bld 172 (*)    Calcium 8.5 (*)    Total Protein 6.1 (*)    AST 14 (*)    All other components within normal limits  URINALYSIS, ROUTINE W REFLEX MICROSCOPIC - Abnormal; Notable for the following components:   Leukocytes,Ua TRACE (*)    All other components within normal limits  URINALYSIS, MICROSCOPIC (REFLEX) - Abnormal; Notable for the following components:   Bacteria, UA RARE (*)    All other components within normal limits  CBC WITH DIFFERENTIAL/PLATELET  LIPASE, BLOOD    EKG EKG Interpretation  Date/Time:  Tuesday September 14 2018 13:30:16 EDT Ventricular Rate:  63 PR Interval:    QRS Duration: 96 QT Interval:  449 QTC Calculation: 460 R Axis:   78 Text Interpretation:  Atrial fibrillation Low voltage, precordial leads similar to prior 5/19 Confirmed by Aletta Edouard [phone]) on 09/14/2018 3:57:41 PM   Radiology Ct Abdomen Pelvis W Contrast  Result Date: 09/14/2018 CLINICAL DATA:  Left-sided abdominal pain for 2 weeks. EXAM: CT ABDOMEN AND PELVIS WITH CONTRAST TECHNIQUE: Multidetector CT imaging of the abdomen and pelvis was performed using the standard protocol following bolus administration of intravenous contrast. CONTRAST:  180mL OMNIPAQUE IOHEXOL 300 MG/ML  SOLN COMPARISON:  Jun 11, 2017 FINDINGS: Lower chest: No acute abnormality. Hepatobiliary: 7 mm too small to be actually  characterize hypoattenuated lesion in the left lobe of the liver. No gallstones, gallbladder wall thickening, or biliary dilatation. Pancreas: Unremarkable. No pancreatic ductal dilatation or surrounding inflammatory changes. Spleen: Normal in size without focal abnormality. Adrenals/Urinary Tract: Normal adrenal glands. Bilateral renal cysts. No evidence of nephrolithiasis or hydronephrosis. Normal urinary bladder. Stomach/Bowel: Stomach is within normal limits. Appendix appears normal. No evidence of bowel wall thickening, distention, or inflammatory changes.Sigmoid diverticulosis without evidence of diverticulitis. Vascular/Lymphatic: Aortic atherosclerosis. No enlarged abdominal or pelvic lymph nodes. Reproductive: Mild enlargement and coarse calcifications of the prostate gland. Other: No abdominal wall hernia or abnormality. No abdominopelvic ascites. Musculoskeletal: Spondylosis of the lumbosacral spine. IMPRESSION: 1. No evidence of acute abnormality within the solid abdominal organs. 2. 7 mm too small to be actually characterize hypoattenuated lesion in the left lobe of the liver. 3. Bilateral renal cysts. 4. Sigmoid diverticulosis without evidence of diverticulitis. 5. Mild enlargement and coarse calcifications of the prostate gland. Electronically Signed   By: Fidela Salisbury M.D.   On: 09/14/2018 17:45    Procedures Procedures (including critical care time)  Medications Ordered in ED Medications  sodium chloride 0.9 % bolus 1,000 mL ( Intravenous Stopped 09/14/18 1626)  iohexol (OMNIPAQUE) 300 MG/ML solution 100 mL (100 mLs Intravenous Contrast Given 09/14/18 1708)     Initial Impression / Assessment and Plan / ED Course  I have reviewed the triage vital signs and the nursing notes.  Pertinent labs & imaging results that were available during my care of the patient were reviewed by me and considered in my medical decision making (see chart for details).  Clinical Course as of Sep 14 899  Tue Sep 13, 9073  3827 85 year old male on anticoagulation here with 2 weeks of progressive left-sided abdominal pain.  No improvement with 5 days of empiric antibiotics.  Soft exam although has some deep tenderness.  Blood pressure low in the 90s although awake alert no complaints of any orthostatic symptoms.   [MB]  1847 Patient CT showed some diverticulosis and a few incidental findings but no obvious  explanation for his symptoms.  I reviewed this with him and he understands to follow-up with his primary care doctor.  I asked that he finish his antibiotics and we also talked about maybe cutting in half his beta-blocker as he has had some dropped heart rates into the 30s being asymptomatic here.  He says his blood pressure is always low but he will talk to his cardiologist about this.   [MB]    Clinical Course User Index [MB] Hayden Rasmussen, MD        Final Clinical Impressions(s) / ED Diagnoses   Final diagnoses:  Left lower quadrant abdominal pain  Bradycardia    ED Discharge Orders    None       Hayden Rasmussen, MD 09/15/18 [id number]

## 2018-09-17 DIAGNOSIS — H353112 Nonexudative age-related macular degeneration, right eye, intermediate dry stage: Secondary | ICD-10-CM | POA: Diagnosis not present

## 2018-09-17 DIAGNOSIS — H34812 Central retinal vein occlusion, left eye, with macular edema: Secondary | ICD-10-CM | POA: Diagnosis not present

## 2018-09-17 DIAGNOSIS — I739 Peripheral vascular disease, unspecified: Secondary | ICD-10-CM | POA: Diagnosis not present

## 2018-09-20 DIAGNOSIS — N3941 Urge incontinence: Secondary | ICD-10-CM | POA: Diagnosis not present

## 2018-09-20 DIAGNOSIS — Z87442 Personal history of urinary calculi: Secondary | ICD-10-CM | POA: Diagnosis not present

## 2018-09-20 DIAGNOSIS — N401 Enlarged prostate with lower urinary tract symptoms: Secondary | ICD-10-CM | POA: Diagnosis not present

## 2018-09-22 DIAGNOSIS — M542 Cervicalgia: Secondary | ICD-10-CM | POA: Diagnosis not present

## 2018-09-22 DIAGNOSIS — R29898 Other symptoms and signs involving the musculoskeletal system: Secondary | ICD-10-CM | POA: Diagnosis not present

## 2018-09-22 DIAGNOSIS — M79605 Pain in left leg: Secondary | ICD-10-CM | POA: Diagnosis not present

## 2018-09-22 DIAGNOSIS — M25561 Pain in right knee: Secondary | ICD-10-CM | POA: Diagnosis not present

## 2018-09-22 DIAGNOSIS — Z789 Other specified health status: Secondary | ICD-10-CM | POA: Diagnosis not present

## 2018-09-22 DIAGNOSIS — R262 Difficulty in walking, not elsewhere classified: Secondary | ICD-10-CM | POA: Diagnosis not present

## 2018-09-22 DIAGNOSIS — G8929 Other chronic pain: Secondary | ICD-10-CM | POA: Diagnosis not present

## 2018-09-22 DIAGNOSIS — M79604 Pain in right leg: Secondary | ICD-10-CM | POA: Diagnosis not present

## 2018-09-22 DIAGNOSIS — M629 Disorder of muscle, unspecified: Secondary | ICD-10-CM | POA: Diagnosis not present

## 2018-09-22 DIAGNOSIS — M25562 Pain in left knee: Secondary | ICD-10-CM | POA: Diagnosis not present

## 2018-09-27 DIAGNOSIS — F4320 Adjustment disorder, unspecified: Secondary | ICD-10-CM | POA: Diagnosis not present

## 2018-09-29 DIAGNOSIS — M25562 Pain in left knee: Secondary | ICD-10-CM | POA: Diagnosis not present

## 2018-09-29 DIAGNOSIS — Z789 Other specified health status: Secondary | ICD-10-CM | POA: Diagnosis not present

## 2018-09-29 DIAGNOSIS — G8929 Other chronic pain: Secondary | ICD-10-CM | POA: Diagnosis not present

## 2018-09-29 DIAGNOSIS — M629 Disorder of muscle, unspecified: Secondary | ICD-10-CM | POA: Diagnosis not present

## 2018-09-29 DIAGNOSIS — R29898 Other symptoms and signs involving the musculoskeletal system: Secondary | ICD-10-CM | POA: Diagnosis not present

## 2018-09-29 DIAGNOSIS — R262 Difficulty in walking, not elsewhere classified: Secondary | ICD-10-CM | POA: Diagnosis not present

## 2018-09-29 DIAGNOSIS — M25561 Pain in right knee: Secondary | ICD-10-CM | POA: Diagnosis not present

## 2018-10-07 DIAGNOSIS — Z789 Other specified health status: Secondary | ICD-10-CM | POA: Diagnosis not present

## 2018-10-07 DIAGNOSIS — M25562 Pain in left knee: Secondary | ICD-10-CM | POA: Diagnosis not present

## 2018-10-07 DIAGNOSIS — R262 Difficulty in walking, not elsewhere classified: Secondary | ICD-10-CM | POA: Diagnosis not present

## 2018-10-07 DIAGNOSIS — M25561 Pain in right knee: Secondary | ICD-10-CM | POA: Diagnosis not present

## 2018-10-07 DIAGNOSIS — G8929 Other chronic pain: Secondary | ICD-10-CM | POA: Diagnosis not present

## 2018-10-07 DIAGNOSIS — R29898 Other symptoms and signs involving the musculoskeletal system: Secondary | ICD-10-CM | POA: Diagnosis not present

## 2018-10-07 DIAGNOSIS — M629 Disorder of muscle, unspecified: Secondary | ICD-10-CM | POA: Diagnosis not present

## 2018-10-11 DIAGNOSIS — F4320 Adjustment disorder, unspecified: Secondary | ICD-10-CM | POA: Diagnosis not present

## 2018-10-12 DIAGNOSIS — G8929 Other chronic pain: Secondary | ICD-10-CM | POA: Diagnosis not present

## 2018-10-12 DIAGNOSIS — Z789 Other specified health status: Secondary | ICD-10-CM | POA: Diagnosis not present

## 2018-10-12 DIAGNOSIS — M25562 Pain in left knee: Secondary | ICD-10-CM | POA: Diagnosis not present

## 2018-10-12 DIAGNOSIS — M25561 Pain in right knee: Secondary | ICD-10-CM | POA: Diagnosis not present

## 2018-10-12 DIAGNOSIS — R29898 Other symptoms and signs involving the musculoskeletal system: Secondary | ICD-10-CM | POA: Diagnosis not present

## 2018-10-12 DIAGNOSIS — M79605 Pain in left leg: Secondary | ICD-10-CM | POA: Diagnosis not present

## 2018-10-12 DIAGNOSIS — M79604 Pain in right leg: Secondary | ICD-10-CM | POA: Diagnosis not present

## 2018-10-12 DIAGNOSIS — R262 Difficulty in walking, not elsewhere classified: Secondary | ICD-10-CM | POA: Diagnosis not present

## 2018-10-12 DIAGNOSIS — M629 Disorder of muscle, unspecified: Secondary | ICD-10-CM | POA: Diagnosis not present

## 2018-10-20 DIAGNOSIS — M79604 Pain in right leg: Secondary | ICD-10-CM | POA: Diagnosis not present

## 2018-10-20 DIAGNOSIS — M25562 Pain in left knee: Secondary | ICD-10-CM | POA: Diagnosis not present

## 2018-10-20 DIAGNOSIS — R262 Difficulty in walking, not elsewhere classified: Secondary | ICD-10-CM | POA: Diagnosis not present

## 2018-10-20 DIAGNOSIS — Z789 Other specified health status: Secondary | ICD-10-CM | POA: Diagnosis not present

## 2018-10-20 DIAGNOSIS — M629 Disorder of muscle, unspecified: Secondary | ICD-10-CM | POA: Diagnosis not present

## 2018-10-20 DIAGNOSIS — R29898 Other symptoms and signs involving the musculoskeletal system: Secondary | ICD-10-CM | POA: Diagnosis not present

## 2018-10-20 DIAGNOSIS — G8929 Other chronic pain: Secondary | ICD-10-CM | POA: Diagnosis not present

## 2018-10-20 DIAGNOSIS — M79605 Pain in left leg: Secondary | ICD-10-CM | POA: Diagnosis not present

## 2018-10-20 DIAGNOSIS — M25561 Pain in right knee: Secondary | ICD-10-CM | POA: Diagnosis not present

## 2018-10-25 DIAGNOSIS — I700 Atherosclerosis of aorta: Secondary | ICD-10-CM | POA: Diagnosis not present

## 2018-10-25 DIAGNOSIS — I4891 Unspecified atrial fibrillation: Secondary | ICD-10-CM | POA: Diagnosis not present

## 2018-10-26 DIAGNOSIS — Z79899 Other long term (current) drug therapy: Secondary | ICD-10-CM | POA: Diagnosis not present

## 2018-10-26 DIAGNOSIS — M170 Bilateral primary osteoarthritis of knee: Secondary | ICD-10-CM | POA: Diagnosis not present

## 2018-10-26 DIAGNOSIS — M25561 Pain in right knee: Secondary | ICD-10-CM | POA: Diagnosis not present

## 2018-10-26 DIAGNOSIS — I4891 Unspecified atrial fibrillation: Secondary | ICD-10-CM | POA: Diagnosis not present

## 2018-10-26 DIAGNOSIS — M5416 Radiculopathy, lumbar region: Secondary | ICD-10-CM | POA: Diagnosis not present

## 2018-10-26 DIAGNOSIS — Z886 Allergy status to analgesic agent status: Secondary | ICD-10-CM | POA: Diagnosis not present

## 2018-10-26 DIAGNOSIS — M25562 Pain in left knee: Secondary | ICD-10-CM | POA: Diagnosis not present

## 2018-10-26 DIAGNOSIS — M7918 Myalgia, other site: Secondary | ICD-10-CM | POA: Diagnosis not present

## 2018-10-26 DIAGNOSIS — Z888 Allergy status to other drugs, medicaments and biological substances status: Secondary | ICD-10-CM | POA: Diagnosis not present

## 2018-10-26 DIAGNOSIS — M47812 Spondylosis without myelopathy or radiculopathy, cervical region: Secondary | ICD-10-CM | POA: Diagnosis not present

## 2018-10-26 DIAGNOSIS — Z885 Allergy status to narcotic agent status: Secondary | ICD-10-CM | POA: Diagnosis not present

## 2018-10-27 DIAGNOSIS — M79604 Pain in right leg: Secondary | ICD-10-CM | POA: Diagnosis not present

## 2018-10-27 DIAGNOSIS — G8929 Other chronic pain: Secondary | ICD-10-CM | POA: Diagnosis not present

## 2018-10-27 DIAGNOSIS — M629 Disorder of muscle, unspecified: Secondary | ICD-10-CM | POA: Diagnosis not present

## 2018-10-27 DIAGNOSIS — M79605 Pain in left leg: Secondary | ICD-10-CM | POA: Diagnosis not present

## 2018-10-27 DIAGNOSIS — Z789 Other specified health status: Secondary | ICD-10-CM | POA: Diagnosis not present

## 2018-10-27 DIAGNOSIS — M25561 Pain in right knee: Secondary | ICD-10-CM | POA: Diagnosis not present

## 2018-10-27 DIAGNOSIS — M25562 Pain in left knee: Secondary | ICD-10-CM | POA: Diagnosis not present

## 2018-10-27 DIAGNOSIS — R29898 Other symptoms and signs involving the musculoskeletal system: Secondary | ICD-10-CM | POA: Diagnosis not present

## 2018-10-27 DIAGNOSIS — R262 Difficulty in walking, not elsewhere classified: Secondary | ICD-10-CM | POA: Diagnosis not present

## 2018-11-01 DIAGNOSIS — F4320 Adjustment disorder, unspecified: Secondary | ICD-10-CM | POA: Diagnosis not present

## 2018-11-03 DIAGNOSIS — M25561 Pain in right knee: Secondary | ICD-10-CM | POA: Diagnosis not present

## 2018-11-03 DIAGNOSIS — M25562 Pain in left knee: Secondary | ICD-10-CM | POA: Diagnosis not present

## 2018-11-03 DIAGNOSIS — G8929 Other chronic pain: Secondary | ICD-10-CM | POA: Diagnosis not present

## 2018-11-09 DIAGNOSIS — M25569 Pain in unspecified knee: Secondary | ICD-10-CM | POA: Diagnosis not present

## 2018-11-15 DIAGNOSIS — F4320 Adjustment disorder, unspecified: Secondary | ICD-10-CM | POA: Diagnosis not present

## 2018-11-16 DIAGNOSIS — G8929 Other chronic pain: Secondary | ICD-10-CM | POA: Diagnosis not present

## 2018-11-16 DIAGNOSIS — M25561 Pain in right knee: Secondary | ICD-10-CM | POA: Diagnosis not present

## 2018-11-16 DIAGNOSIS — M170 Bilateral primary osteoarthritis of knee: Secondary | ICD-10-CM | POA: Diagnosis not present

## 2018-11-16 DIAGNOSIS — M25562 Pain in left knee: Secondary | ICD-10-CM | POA: Diagnosis not present

## 2018-11-24 DIAGNOSIS — G8929 Other chronic pain: Secondary | ICD-10-CM | POA: Diagnosis not present

## 2018-11-24 DIAGNOSIS — M25561 Pain in right knee: Secondary | ICD-10-CM | POA: Diagnosis not present

## 2018-11-24 DIAGNOSIS — M25562 Pain in left knee: Secondary | ICD-10-CM | POA: Diagnosis not present

## 2018-11-26 DIAGNOSIS — H34812 Central retinal vein occlusion, left eye, with macular edema: Secondary | ICD-10-CM | POA: Diagnosis not present

## 2018-12-07 DIAGNOSIS — I4891 Unspecified atrial fibrillation: Secondary | ICD-10-CM | POA: Diagnosis not present

## 2018-12-07 DIAGNOSIS — N401 Enlarged prostate with lower urinary tract symptoms: Secondary | ICD-10-CM | POA: Diagnosis not present

## 2018-12-07 DIAGNOSIS — M170 Bilateral primary osteoarthritis of knee: Secondary | ICD-10-CM | POA: Diagnosis not present

## 2018-12-07 DIAGNOSIS — M5416 Radiculopathy, lumbar region: Secondary | ICD-10-CM | POA: Diagnosis not present

## 2018-12-07 DIAGNOSIS — R2242 Localized swelling, mass and lump, left lower limb: Secondary | ICD-10-CM | POA: Diagnosis not present

## 2018-12-07 DIAGNOSIS — M47812 Spondylosis without myelopathy or radiculopathy, cervical region: Secondary | ICD-10-CM | POA: Diagnosis not present

## 2018-12-07 DIAGNOSIS — M25562 Pain in left knee: Secondary | ICD-10-CM | POA: Diagnosis not present

## 2018-12-07 DIAGNOSIS — Z0000 Encounter for general adult medical examination without abnormal findings: Secondary | ICD-10-CM | POA: Diagnosis not present

## 2018-12-07 DIAGNOSIS — M5442 Lumbago with sciatica, left side: Secondary | ICD-10-CM | POA: Diagnosis not present

## 2018-12-07 DIAGNOSIS — R7302 Impaired glucose tolerance (oral): Secondary | ICD-10-CM | POA: Diagnosis not present

## 2018-12-07 DIAGNOSIS — R32 Unspecified urinary incontinence: Secondary | ICD-10-CM | POA: Diagnosis not present

## 2018-12-07 DIAGNOSIS — M542 Cervicalgia: Secondary | ICD-10-CM | POA: Diagnosis not present

## 2018-12-07 DIAGNOSIS — M47816 Spondylosis without myelopathy or radiculopathy, lumbar region: Secondary | ICD-10-CM | POA: Diagnosis not present

## 2018-12-07 DIAGNOSIS — G4700 Insomnia, unspecified: Secondary | ICD-10-CM | POA: Diagnosis not present

## 2018-12-07 DIAGNOSIS — I482 Chronic atrial fibrillation, unspecified: Secondary | ICD-10-CM | POA: Diagnosis not present

## 2018-12-07 DIAGNOSIS — R350 Frequency of micturition: Secondary | ICD-10-CM | POA: Diagnosis not present

## 2018-12-07 DIAGNOSIS — M25561 Pain in right knee: Secondary | ICD-10-CM | POA: Diagnosis not present

## 2018-12-07 DIAGNOSIS — M25362 Other instability, left knee: Secondary | ICD-10-CM | POA: Diagnosis not present

## 2018-12-07 DIAGNOSIS — N3941 Urge incontinence: Secondary | ICD-10-CM | POA: Diagnosis not present

## 2018-12-07 DIAGNOSIS — G629 Polyneuropathy, unspecified: Secondary | ICD-10-CM | POA: Diagnosis not present

## 2018-12-07 DIAGNOSIS — Z125 Encounter for screening for malignant neoplasm of prostate: Secondary | ICD-10-CM | POA: Diagnosis not present

## 2018-12-07 DIAGNOSIS — R351 Nocturia: Secondary | ICD-10-CM | POA: Diagnosis not present

## 2018-12-07 DIAGNOSIS — R799 Abnormal finding of blood chemistry, unspecified: Secondary | ICD-10-CM | POA: Diagnosis not present

## 2018-12-07 DIAGNOSIS — Z7901 Long term (current) use of anticoagulants: Secondary | ICD-10-CM | POA: Diagnosis not present

## 2018-12-07 DIAGNOSIS — G8929 Other chronic pain: Secondary | ICD-10-CM | POA: Diagnosis not present

## 2018-12-07 DIAGNOSIS — Z23 Encounter for immunization: Secondary | ICD-10-CM | POA: Diagnosis not present

## 2018-12-07 DIAGNOSIS — M2352 Chronic instability of knee, left knee: Secondary | ICD-10-CM | POA: Diagnosis not present

## 2018-12-09 DIAGNOSIS — G629 Polyneuropathy, unspecified: Secondary | ICD-10-CM | POA: Diagnosis not present

## 2018-12-12 DIAGNOSIS — M25462 Effusion, left knee: Secondary | ICD-10-CM | POA: Diagnosis not present

## 2018-12-12 DIAGNOSIS — M23322 Other meniscus derangements, posterior horn of medial meniscus, left knee: Secondary | ICD-10-CM | POA: Diagnosis not present

## 2018-12-12 DIAGNOSIS — M23362 Other meniscus derangements, other lateral meniscus, left knee: Secondary | ICD-10-CM | POA: Diagnosis not present

## 2018-12-14 DIAGNOSIS — M5416 Radiculopathy, lumbar region: Secondary | ICD-10-CM | POA: Diagnosis not present

## 2018-12-15 DIAGNOSIS — L570 Actinic keratosis: Secondary | ICD-10-CM | POA: Diagnosis not present

## 2018-12-15 DIAGNOSIS — Z85828 Personal history of other malignant neoplasm of skin: Secondary | ICD-10-CM | POA: Diagnosis not present

## 2018-12-15 DIAGNOSIS — L821 Other seborrheic keratosis: Secondary | ICD-10-CM | POA: Diagnosis not present

## 2018-12-16 DIAGNOSIS — M1712 Unilateral primary osteoarthritis, left knee: Secondary | ICD-10-CM | POA: Diagnosis not present

## 2018-12-16 DIAGNOSIS — S83204S Other tear of unspecified meniscus, current injury, left knee, sequela: Secondary | ICD-10-CM | POA: Diagnosis not present

## 2018-12-20 DIAGNOSIS — F4320 Adjustment disorder, unspecified: Secondary | ICD-10-CM | POA: Diagnosis not present

## 2018-12-22 DIAGNOSIS — M25562 Pain in left knee: Secondary | ICD-10-CM | POA: Diagnosis not present

## 2018-12-22 DIAGNOSIS — M1712 Unilateral primary osteoarthritis, left knee: Secondary | ICD-10-CM | POA: Diagnosis not present

## 2018-12-22 DIAGNOSIS — S83204S Other tear of unspecified meniscus, current injury, left knee, sequela: Secondary | ICD-10-CM | POA: Diagnosis not present

## 2018-12-27 ENCOUNTER — Emergency Department (HOSPITAL_BASED_OUTPATIENT_CLINIC_OR_DEPARTMENT_OTHER): Payer: Medicare Other

## 2018-12-27 ENCOUNTER — Inpatient Hospital Stay (HOSPITAL_BASED_OUTPATIENT_CLINIC_OR_DEPARTMENT_OTHER)
Admission: EM | Admit: 2018-12-27 | Discharge: 2018-12-29 | DRG: 312 | Disposition: A | Discharge status: Home / Self Care | Payer: Medicare Other | Attending: Internal Medicine | Admitting: Internal Medicine

## 2018-12-27 ENCOUNTER — Encounter (HOSPITAL_BASED_OUTPATIENT_CLINIC_OR_DEPARTMENT_OTHER): Payer: Self-pay | Admitting: *Deleted

## 2018-12-27 ENCOUNTER — Other Ambulatory Visit: Payer: Self-pay

## 2018-12-27 DIAGNOSIS — R739 Hyperglycemia, unspecified: Secondary | ICD-10-CM | POA: Diagnosis present

## 2018-12-27 DIAGNOSIS — G4733 Obstructive sleep apnea (adult) (pediatric): Secondary | ICD-10-CM | POA: Diagnosis present

## 2018-12-27 DIAGNOSIS — Z66 Do not resuscitate: Secondary | ICD-10-CM | POA: Diagnosis present

## 2018-12-27 DIAGNOSIS — E872 Acidosis, unspecified: Secondary | ICD-10-CM | POA: Diagnosis present

## 2018-12-27 DIAGNOSIS — R7401 Elevation of levels of liver transaminase levels: Secondary | ICD-10-CM | POA: Diagnosis not present

## 2018-12-27 DIAGNOSIS — Z20828 Contact with and (suspected) exposure to other viral communicable diseases: Secondary | ICD-10-CM | POA: Diagnosis present

## 2018-12-27 DIAGNOSIS — I959 Hypotension, unspecified: Secondary | ICD-10-CM | POA: Diagnosis present

## 2018-12-27 DIAGNOSIS — R0602 Shortness of breath: Secondary | ICD-10-CM | POA: Diagnosis not present

## 2018-12-27 DIAGNOSIS — J45909 Unspecified asthma, uncomplicated: Secondary | ICD-10-CM | POA: Diagnosis present

## 2018-12-27 DIAGNOSIS — Z7901 Long term (current) use of anticoagulants: Secondary | ICD-10-CM

## 2018-12-27 DIAGNOSIS — R55 Syncope and collapse: Secondary | ICD-10-CM

## 2018-12-27 DIAGNOSIS — N400 Enlarged prostate without lower urinary tract symptoms: Secondary | ICD-10-CM | POA: Diagnosis present

## 2018-12-27 DIAGNOSIS — I482 Chronic atrial fibrillation, unspecified: Secondary | ICD-10-CM | POA: Diagnosis not present

## 2018-12-27 DIAGNOSIS — K219 Gastro-esophageal reflux disease without esophagitis: Secondary | ICD-10-CM | POA: Diagnosis present

## 2018-12-27 DIAGNOSIS — Z79899 Other long term (current) drug therapy: Secondary | ICD-10-CM

## 2018-12-27 DIAGNOSIS — I10 Essential (primary) hypertension: Secondary | ICD-10-CM | POA: Diagnosis present

## 2018-12-27 DIAGNOSIS — F4320 Adjustment disorder, unspecified: Secondary | ICD-10-CM | POA: Diagnosis not present

## 2018-12-27 DIAGNOSIS — E1165 Type 2 diabetes mellitus with hyperglycemia: Secondary | ICD-10-CM | POA: Diagnosis present

## 2018-12-27 DIAGNOSIS — R7989 Other specified abnormal findings of blood chemistry: Secondary | ICD-10-CM

## 2018-12-27 DIAGNOSIS — T447X5A Adverse effect of beta-adrenoreceptor antagonists, initial encounter: Secondary | ICD-10-CM | POA: Diagnosis not present

## 2018-12-27 DIAGNOSIS — F419 Anxiety disorder, unspecified: Secondary | ICD-10-CM | POA: Diagnosis present

## 2018-12-27 DIAGNOSIS — E119 Type 2 diabetes mellitus without complications: Secondary | ICD-10-CM

## 2018-12-27 DIAGNOSIS — I952 Hypotension due to drugs: Secondary | ICD-10-CM | POA: Diagnosis not present

## 2018-12-27 DIAGNOSIS — R1032 Left lower quadrant pain: Secondary | ICD-10-CM | POA: Diagnosis present

## 2018-12-27 LAB — CBC WITH DIFFERENTIAL/PLATELET
Abs Immature Granulocytes: 0.13 10*3/uL — ABNORMAL HIGH (ref 0.00–0.07)
Basophils Absolute: 0.1 10*3/uL (ref 0.0–0.1)
Basophils Relative: 1 %
Eosinophils Absolute: 0.1 10*3/uL (ref 0.0–0.5)
Eosinophils Relative: 1 %
HCT: 47.3 % (ref 39.0–52.0)
Hemoglobin: 15.6 g/dL (ref 13.0–17.0)
Immature Granulocytes: 1 %
Lymphocytes Relative: 10 %
Lymphs Abs: 1.1 10*3/uL (ref 0.7–4.0)
MCH: 30.2 pg (ref 26.0–34.0)
MCHC: 33 g/dL (ref 30.0–36.0)
MCV: 91.7 fL (ref 80.0–100.0)
Monocytes Absolute: 0.6 10*3/uL (ref 0.1–1.0)
Monocytes Relative: 5 %
Neutro Abs: 9.1 10*3/uL — ABNORMAL HIGH (ref 1.7–7.7)
Neutrophils Relative %: 82 %
Platelets: 215 10*3/uL (ref 150–400)
RBC: 5.16 MIL/uL (ref 4.22–5.81)
RDW: 12.8 % (ref 11.5–15.5)
WBC: 11 10*3/uL — ABNORMAL HIGH (ref 4.0–10.5)
nRBC: 0 % (ref 0.0–0.2)

## 2018-12-27 LAB — COMPREHENSIVE METABOLIC PANEL
ALT: 12 U/L (ref 0–44)
AST: 14 U/L — ABNORMAL LOW (ref 15–41)
Albumin: 4 g/dL (ref 3.5–5.0)
Alkaline Phosphatase: 75 U/L (ref 38–126)
Anion gap: 11 (ref 5–15)
BUN: 18 mg/dL (ref 8–23)
CO2: 21 mmol/L — ABNORMAL LOW (ref 22–32)
Calcium: 8.5 mg/dL — ABNORMAL LOW (ref 8.9–10.3)
Chloride: 104 mmol/L (ref 98–111)
Creatinine, Ser: 1.06 mg/dL (ref 0.61–1.24)
GFR calc Af Amer: >60 mL/min (ref >60)
GFR calc non Af Amer: >60 mL/min (ref >60)
Glucose, Bld: 221 mg/dL — ABNORMAL HIGH (ref 70–99)
Potassium: 4.1 mmol/L (ref 3.5–5.1)
Sodium: 136 mmol/L (ref 135–145)
Total Bilirubin: 1 mg/dL (ref 0.3–1.2)
Total Protein: 6.2 g/dL — ABNORMAL LOW (ref 6.5–8.1)

## 2018-12-27 LAB — URINALYSIS, ROUTINE W REFLEX MICROSCOPIC
Bilirubin Urine: NEGATIVE
Glucose, UA: NEGATIVE mg/dL
Hgb urine dipstick: NEGATIVE
Ketones, ur: NEGATIVE mg/dL
Leukocytes,Ua: NEGATIVE
Nitrite: NEGATIVE
Protein, ur: NEGATIVE mg/dL
Specific Gravity, Urine: 1.01 (ref 1.005–1.030)
pH: 6.5 (ref 5.0–8.0)

## 2018-12-27 LAB — PROTIME-INR
INR: 1.4 — ABNORMAL HIGH (ref 0.8–1.2)
Prothrombin Time: 16.7 seconds — ABNORMAL HIGH (ref 11.4–15.2)

## 2018-12-27 LAB — APTT: aPTT: 29 seconds (ref 24–36)

## 2018-12-27 LAB — LACTIC ACID, PLASMA
Lactic Acid, Venous: 3.5 mmol/L (ref 0.5–1.9)
Lactic Acid, Venous: 3.1 mmol/L (ref 0.5–1.9)

## 2018-12-27 LAB — TSH: TSH: 3.426 u[IU]/mL (ref 0.350–4.500)

## 2018-12-27 LAB — TROPONIN I (HIGH SENSITIVITY)
Troponin I (High Sensitivity): <2 ng/L (ref <18)
Troponin I (High Sensitivity): <2 ng/L (ref <18)

## 2018-12-27 LAB — LIPASE, BLOOD: Lipase: 22 U/L (ref 11–51)

## 2018-12-27 MED ORDER — VANCOMYCIN HCL IN DEXTROSE 1-5 GM/200ML-% IV SOLN: 1000.0000 mg | Freq: Once | INTRAVENOUS | Status: DC

## 2018-12-27 MED ORDER — VANCOMYCIN HCL 10 G IV SOLR
1500.0000 mg | Freq: Once | INTRAVENOUS | Status: DC
  Filled 2018-12-27: qty 1500

## 2018-12-27 MED ORDER — ONDANSETRON HCL 4 MG/2ML IJ SOLN
INTRAMUSCULAR | Status: AC
  Administered 2018-12-27: 4 mg via INTRAVENOUS
  Filled 2018-12-27: qty 2

## 2018-12-27 MED ORDER — ONDANSETRON HCL 4 MG/2ML IJ SOLN
4.0000 mg | Freq: Once | INTRAMUSCULAR | Status: AC
  Administered 2018-12-27: 21:00:00 4 mg via INTRAVENOUS

## 2018-12-27 MED ORDER — IOHEXOL 300 MG/ML  SOLN
100.0000 mL | Freq: Once | INTRAMUSCULAR | Status: AC | PRN
  Administered 2018-12-27: 100 mL via INTRAVENOUS

## 2018-12-27 MED ORDER — SODIUM CHLORIDE 0.9 % IV BOLUS
1000.0000 mL | Freq: Once | INTRAVENOUS | Status: AC
  Administered 2018-12-27: 1000 mL via INTRAVENOUS

## 2018-12-27 MED ORDER — METRONIDAZOLE IN NACL 5-0.79 MG/ML-% IV SOLN
500.0000 mg | Freq: Once | INTRAVENOUS | Status: AC
  Administered 2018-12-27: 500 mg via INTRAVENOUS
  Filled 2018-12-27: qty 100

## 2018-12-27 MED ORDER — SODIUM CHLORIDE 0.9 % IV SOLN
2.0000 g | Freq: Once | INTRAVENOUS | Status: AC
  Administered 2018-12-27: 2 g via INTRAVENOUS
  Filled 2018-12-27: qty 2

## 2018-12-27 NOTE — ED Notes (Signed)
Date and time results received: 12/27/18 2318 (use smartphrase ".now" to insert current time)  Test: Lactic Acid Critical Value: 3.1  Name of Provider Notified: Petrucelli  Orders Received? Or Actions Taken?: none

## 2018-12-27 NOTE — ED Notes (Signed)
Date and time results received: 12/27/18 2119  Test: lactic acid Critical Value: 3.5  Name of Provider Notified: Petrucelli PA  Orders Received? Or Actions Taken?: no new orders

## 2018-12-27 NOTE — ED Triage Notes (Signed)
States his wife is dying and he had an issue with a caregiver today. States his BP was elevated and his family was concerned. He states he has been anxious over the events that occurred today.

## 2018-12-27 NOTE — Plan of Care (Addendum)
85 yo M with near syncope, BPs in the 70s/30s initially, Lactate 3.5.  Being treated as sepsis by EDP with cefepime and vanc.  Will put in for SDU bed but no SDUs available so probably will be over at [facility] all night.  EKG a.fib and bradycardia.  Update: BP remains stable for several hours now, will switch bed request to tele.

## 2018-12-27 NOTE — ED Provider Notes (Signed)
[facility] DEPART[facility] Provider Note   CSN: [id number] Arrival date & time: 12/27/18  1905     History   Chief Complaint Chief Complaint  Patient presents with   Dizziness   Hypotension    HPI Jonathan Gordon is a 85 y.o. male with a history of A. Fib anticoagulated on eliquis, asthma, BPH, GERD, hypertension, and OSA who presents to the ED with complaints of lightheadedness that began @ 16:00 this afternoon. Patient states he was out at [employer] for candles when he became lightheaded & generally weak as if he may pass out. He slowly made his way to the car and drove home where the nurse who cares for his wife who is on hospice took his BP and noted it to be 70s/30s. He tried to eat something but this did not help much leading to ED visit. He currently feels lightheaded, generally weak, nauseated, a bit shaky, and as if he needs to have a BM. He states that he believes he is in afib which he goes in and out of as he feels mildly short of breath which happens with his afib. Prior to 1600 he was in his regular state of health. He had plenty to eat/drink today. Denies fever, URI sxs, cough, chest pain, abdominal pain, syncope, numbness, focal weakness, headache, or visual disturbance. Taking his metorpolol & diltiazem as prescribed, he recently had his metoprolol dose cut in half- family member has been cutting this in half each morning, unsure if this maybe was not cut in half today. No other med changes.      HPI  Past Medical History:  Diagnosis Date   Actinic keratoses    Arthritis    Asthma    Atrial fibrillation (HCC)    Atrial fibrillation, chronic (HCC) 06/11/2017   Back pain    BPH (benign prostatic hyperplasia)    Cancer (HCC)    skin   GERD (gastroesophageal reflux disease)    Hypertension    Neck pain    Neuropathy    Pseudophakia    Pulmonary nodule    Renal disorder    kidney stone    Patient Active Problem List   Diagnosis Date Noted   Left lower quadrant pain 06/11/2017   Low back pain 06/11/2017   Atrial fibrillation, chronic ([place]) 06/11/2017   OSA (obstructive sleep apnea) 12/26/2016   Upper airway cough syndrome 11/28/2016   Dyspnea on exertion 08/26/2016   Bronchiectasis without complication ([place]) [id number]    Past Surgical History:  Procedure Laterality Date   BAND HEMORRHOIDECTOMY     CARDIAC CATHETERIZATION     HEMORRHOID SURGERY          Home Medications    Prior to Admission medications   Medication Sig Start Date End Date Taking? Authorizing Provider  apixaban (ELIQUIS) 5 MG TABS tablet Take 5 mg by mouth 2 (two) times daily.    [provider]  cephALEXin (KEFLEX) 500 MG capsule Take 500 mg by mouth 2 (two) times daily.    [provider]  diltiazem (DILACOR XR) 180 MG 24 hr capsule Take 180 mg by mouth daily.    [provider]  Ergocalciferol (VITAMIN D2) 2000 units TABS Take 2,000 Units by mouth daily.     [provider]  finasteride (PROSCAR) 5 MG tablet  06/01/18   [provider]  gabapentin (NEURONTIN) 300 MG capsule Take 900 mg by mouth at bedtime.  02/23/17   [provider]  ibuprofen (ADVIL,MOTRIN) 800 MG tablet Take 800 mg by mouth every 8 (eight) hours as needed for mild pain.     [provider]  LORazepam (ATIVAN) 1 MG tablet Take 1 mg by mouth daily as needed for anxiety.    [provider]  Magnesium 250 MG TABS Take 250 mg by mouth daily.     [provider]  metoprolol succinate (TOPROL-XL) 25 MG 24 hr tablet Take 25 mg by mouth daily. Pt sts dose increased to "a whole pill" 1 yr ago    [provider]  Multiple Vitamins-Minerals (CENTRUM SILVER PO) Take 1 tablet by mouth daily.    [provider]  tamsulosin (FLOMAX) 0.4 MG CAPS capsule  06/01/18   [provider]  Vitamins-Lipotropics (SUPER B-50 COMPLEX PLUS PO) Take 1 tablet by mouth daily.     [provider]    Family History No family history on file.  Social History Social History   Tobacco Use   Smoking status: Never Smoker   Smokeless tobacco: Never Used  Substance Use Topics   Alcohol use: No   Drug use: No     Allergies   Hydromorphone, Coumadin [warfarin sodium], Hydrocodone-acetaminophen, Oxycontin [oxycodone hcl], and Xarelto [rivaroxaban]   Review of Systems Review of Systems  Constitutional: Positive for diaphoresis. Negative for chills and fever.  HENT: Negative for congestion, ear pain and sore throat.   Eyes: Negative for visual disturbance.  Respiratory: Positive for shortness of breath. Negative for cough.   Cardiovascular: Negative for chest pain.  Gastrointestinal: Positive for nausea. Negative for abdominal pain, anal bleeding, blood in stool, constipation and vomiting.  Genitourinary: Negative for dysuria.  Neurological: Positive for weakness and light-headedness. Negative for syncope, facial asymmetry, numbness and headaches.  All other systems reviewed and are negative.   Physical Exam Updated Vital Signs BP (!) 77/48 (BP Location: Right Arm)    Pulse (!) 50    Temp 97.8 F (36.6 C) (Oral)    Resp 18    Ht 5\' 8"  (1.727 m)    Wt 85.3 kg    SpO2 97%    BMI 28.59 kg/m   Physical Exam Vitals signs and nursing note reviewed.  Constitutional:      Appearance: He is well-developed. He is ill-appearing.     Comments: Somewhat rigorous intermittently.   HENT:     Head: Normocephalic and atraumatic.  Eyes:     General:        Right eye: No discharge.        Left eye: No discharge.     Extraocular Movements: Extraocular movements intact.     Conjunctiva/sclera: Conjunctivae normal.     Pupils: Pupils are equal, round, and reactive to light.  Neck:     Musculoskeletal: Neck supple. No neck rigidity.  Cardiovascular:     Rate and Rhythm: Bradycardia present. Rhythm irregularly irregular.  Pulmonary:     Effort: Pulmonary  effort is normal. No respiratory distress.     Breath sounds: Normal breath sounds. No wheezing, rhonchi or rales.  Abdominal:     General: There is no distension.     Palpations: Abdomen is soft.     Tenderness: There is abdominal tenderness (LLQ). There is no right CVA tenderness, left CVA tenderness, guarding or rebound.  Skin:    General: Skin is warm and dry.     Coloration: Skin is pale.     Findings: No rash.  Neurological:     General: No  focal deficit present.     Mental Status: He is alert.     Comments: Clear speech.   Psychiatric:        Behavior: Behavior normal.    ED Treatments / Results  Labs (all labs ordered are listed, but only abnormal results are displayed) Labs Reviewed  COMPREHENSIVE METABOLIC PANEL - Abnormal; Notable for the following components:      Result Value   CO2 21 (*)    Glucose, Bld 221 (*)    Calcium 8.5 (*)    Total Protein 6.2 (*)    AST 14 (*)    All other components within normal limits  CBC WITH DIFFERENTIAL/PLATELET - Abnormal; Notable for the following components:   WBC 11.0 (*)    Neutro Abs 9.1 (*)    Abs Immature Granulocytes 0.13 (*)    All other components within normal limits  LACTIC ACID, PLASMA - Abnormal; Notable for the following components:   Lactic Acid, Venous 3.5 (*)    All other components within normal limits  PROTIME-INR - Abnormal; Notable for the following components:   Prothrombin Time 16.7 (*)    INR 1.4 (*)    All other components within normal limits  URINE CULTURE  CULTURE, BLOOD (ROUTINE X 2)  CULTURE, BLOOD (ROUTINE X 2)  SARS CORONAVIRUS 2 (TAT 6-24 HRS)  LIPASE, BLOOD  APTT  URINALYSIS, ROUTINE W REFLEX MICROSCOPIC  LACTIC ACID, PLASMA  TSH  TROPONIN I (HIGH SENSITIVITY)  TROPONIN I (HIGH SENSITIVITY)    EKG EKG Interpretation  Date/Time:  Monday December 27 2018 19:56:27 EST Ventricular Rate:  42 PR Interval:    QRS Duration: 107 QT Interval:  504 QTC Calculation: 422 R  Axis:   75 Text Interpretation: Bradycardia with irregular rate Low voltage, precordial leads Similar to 2019 tracing. No STEMI Confirmed by Nanda Quinton [phone]) on 12/27/2018 8:04:14 PM   Radiology Ct Abdomen Pelvis W Contrast  Result Date: 12/27/2018 CLINICAL DATA:  Abdominal pain EXAM: CT ABDOMEN AND PELVIS WITH CONTRAST TECHNIQUE: Multidetector CT imaging of the abdomen and pelvis was performed using the standard protocol following bolus administration of intravenous contrast. CONTRAST:  157mL OMNIPAQUE IOHEXOL 300 MG/ML  SOLN COMPARISON:  September 14, 2018 FINDINGS: Lower chest: The visualized heart size within normal limits. No pericardial fluid/thickening. No hiatal hernia. The visualized portions of the lungs are clear. Hepatobiliary: There is a small 7 mm low-density lesion seen in the anterior left liver lobe as on prior exam.The main portal vein is patent. No evidence of calcified gallstones, gallbladder wall thickening or biliary dilatation. Pancreas: Unremarkable. No pancreatic ductal dilatation or surrounding inflammatory changes. Spleen: Normal in size without focal abnormality. Adrenals/Urinary Tract: Both adrenal glands appear normal. Bilateral low-density lesions are seen within both kidneys the largest measuring 4 cm in the upper pole of the left kidney, likely renal cyst. No hydronephrosis is seen. No perinephric stranding. The bladder is partially distended. Stomach/Bowel: The stomach and small bowel are normal in appearance. Scattered colonic diverticula are noted without diverticulitis. The appendix is unremarkable. Vascular/Lymphatic: There are no enlarged mesenteric, retroperitoneal, or pelvic lymph nodes. Scattered aortic atherosclerotic calcifications are seen without aneurysmal dilatation. Reproductive: Coarse calcifications again noted within the prostate gland. Other: Small fat containing inguinal hernias are noted. Musculoskeletal: No acute or significant osseous findings.  IMPRESSION: No acute intra-abdominal or pelvic pathology to explain the patient's symptoms. Aortic Atherosclerosis (ICD10-I70.0). Bilateral renal cysts Diverticulosis without diverticulitis. Electronically Signed   By: Ebony Cargo.D.  On: 12/27/2018 22:00   Dg Chest Portable 1 View  Result Date: 12/27/2018 CLINICAL DATA:  Lightheaded EXAM: PORTABLE CHEST 1 VIEW COMPARISON:  11/17/2012 FINDINGS: Heart and mediastinal contours are within normal limits. No focal opacities or effusions. No acute bony abnormality. IMPRESSION: No active disease. Electronically Signed   By: Rolm Baptise M.D.   On: 12/27/2018 21:58    Procedures .Critical Care Performed by: Amaryllis Dyke, PA-C Authorized by: Amaryllis Dyke, PA-C      CRITICAL CARE Performed by: Kennith Maes   Total critical care time: 30 minutes  Critical care time was exclusive of separately billable procedures and treating other patients.  Critical care was necessary to treat or prevent imminent or life-threatening deterioration.  Critical care was time spent personally by me on the following activities: development of treatment plan with patient and/or surrogate as well as nursing, discussions with consultants, evaluation of patient's response to treatment, examination of patient, obtaining history from patient or surrogate, ordering and performing treatments and interventions, ordering and review of laboratory studies, ordering and review of radiographic studies, pulse oximetry and re-evaluation of patient's condition.  (including critical care time)  Medications Ordered in ED Medications  metroNIDAZOLE (FLAGYL) IVPB 500 mg (500 mg Intravenous New Bag/Given 12/27/18 2236)  vancomycin (VANCOCIN) 1,500 mg in sodium chloride 0.9 % 500 mL IVPB (has no administration in time range)  sodium chloride 0.9 % bolus 1,000 mL (1,000 mLs Intravenous New Bag/Given 12/27/18 2048)  ondansetron (ZOFRAN) injection 4 mg (4 mg  Intravenous Given 12/27/18 2032)  iohexol (OMNIPAQUE) 300 MG/ML solution 100 mL (100 mLs Intravenous Contrast Given 12/27/18 2135)  sodium chloride 0.9 % bolus 1,000 mL (1,000 mLs Intravenous New Bag/Given 12/27/18 2202)  ceFEPIme (MAXIPIME) 2 g in sodium chloride 0.9 % 100 mL IVPB (0 g Intravenous Stopped 12/27/18 2235)     Initial Impression / Assessment and Plan / ED Course  I have reviewed the triage vital signs and the nursing notes.  Pertinent labs & imaging results that were available during my care of the patient were reviewed by me and considered in my medical decision making (see chart for details).   Patient presents to the emergency department for evaluation of lightheadedness, generalized weakness, nausea, and hypotension.  Patient is ill appearing, pale, intermittently rigorous, noted to be hypotensive with initial pressure 70s/40s, I personally repeated patient's blood pressure in the exam room with a BP of 80s/60s.  Noted to be bradycardic ranging 35-60 in A. fib which he has a history of.  He needed to have a bowel movement on my initial assessment, had a large volume BM-stool soft/brown without melena or hematochezia.  This made him feel a bit better but did not have a significant change with his vital signs.  He does have some abdominal tenderness in the left lower quadrant without peritoneal signs.  Afebrile on rectal temp.  We will start with 1 L of fluids, zofran, and broad work-up. Patient staffed with supervising physician Dr. Laverta Baltimore, in agreement with plan/work-up & holding off on atropine at this time as this was considered.   CBC: Leukocytosis at 11,000 with left shift.  No anemia. CMP: Hyperglycemia.  Mild hypocalcemia.  No significant electrolyte derangement.  Renal function WNL.  LFTs are not elevated. Lipase: WNL Lactic acid: Elevated at 3.5, additional liter of fluid ordered and broad-spectrum antibiotics initiated to cover for possible sepsis- no identified  source Troponin: Less than 2 EKG: Bradycardia.  No STEMI. CXR: No active disease.  CT A/P: No acute intra-abdominal or pelvic pathology to explain the patient's symptoms. Aortic Atherosclerosis (ICD10-I70.0). Bilateral renal cysts Diverticulosis without diverticulitis.  Patient feeling improved, tolerating PO, pressure improved to 100/52, HR in the 60s--> more similar to his baseline vitals on chart review. Given his near syncope type presentation with significant hypotension on arrival & lactic acidosis will consult for admission.   This is a shared visit with supervising physician Dr. Laverta Baltimore who has independently evaluated patient & provided guidance in evaluation/management/disposition, in agreement with care   Findings & plan of care discussed with patient & his daughter @ bedside, provided opportunity for questions- they have confirmed understanding & are in agreement.   22:38: CONSULT: Discussed with Dr. Alcario Drought- accepts admission.   Jammie Kalan was evaluated in Emergency Department on 12/27/2018 for the symptoms described in the history of present illness. He/she was evaluated in the context of the global COVID-19 pandemic, which necessitated consideration that the patient might be at risk for infection with the SARS-CoV-2 virus that causes COVID-19. Institutional protocols and algorithms that pertain to the evaluation of patients at risk for COVID-19 are in a state of rapid change based on information released by regulatory bodies including the CDC and federal and state organizations. These policies and algorithms were followed during the patient's care in the ED.  Final Clinical Impressions(s) / ED Diagnoses   Final diagnoses:  Near syncope  Hypotension, unspecified hypotension type  Elevated lactic acid level    ED Discharge Orders    None       Amaryllis Dyke, PA-C 12/27/18 2245    Margette Fast, MD 12/28/18 [phone]

## 2018-12-28 ENCOUNTER — Encounter (HOSPITAL_COMMUNITY): Payer: Self-pay | Admitting: Internal Medicine

## 2018-12-28 DIAGNOSIS — I482 Chronic atrial fibrillation, unspecified: Secondary | ICD-10-CM | POA: Diagnosis not present

## 2018-12-28 DIAGNOSIS — R739 Hyperglycemia, unspecified: Secondary | ICD-10-CM | POA: Diagnosis present

## 2018-12-28 DIAGNOSIS — I10 Essential (primary) hypertension: Secondary | ICD-10-CM | POA: Diagnosis not present

## 2018-12-28 DIAGNOSIS — R1032 Left lower quadrant pain: Secondary | ICD-10-CM

## 2018-12-28 DIAGNOSIS — G4733 Obstructive sleep apnea (adult) (pediatric): Secondary | ICD-10-CM | POA: Diagnosis not present

## 2018-12-28 DIAGNOSIS — R55 Syncope and collapse: Secondary | ICD-10-CM | POA: Diagnosis not present

## 2018-12-28 DIAGNOSIS — E1165 Type 2 diabetes mellitus with hyperglycemia: Secondary | ICD-10-CM | POA: Diagnosis present

## 2018-12-28 DIAGNOSIS — Z7901 Long term (current) use of anticoagulants: Secondary | ICD-10-CM | POA: Diagnosis not present

## 2018-12-28 DIAGNOSIS — E872 Acidosis, unspecified: Secondary | ICD-10-CM | POA: Diagnosis present

## 2018-12-28 DIAGNOSIS — I952 Hypotension due to drugs: Secondary | ICD-10-CM | POA: Diagnosis not present

## 2018-12-28 DIAGNOSIS — Z66 Do not resuscitate: Secondary | ICD-10-CM | POA: Diagnosis not present

## 2018-12-28 DIAGNOSIS — K219 Gastro-esophageal reflux disease without esophagitis: Secondary | ICD-10-CM | POA: Diagnosis present

## 2018-12-28 DIAGNOSIS — T447X5A Adverse effect of beta-adrenoreceptor antagonists, initial encounter: Secondary | ICD-10-CM | POA: Diagnosis not present

## 2018-12-28 DIAGNOSIS — Z20828 Contact with and (suspected) exposure to other viral communicable diseases: Secondary | ICD-10-CM | POA: Diagnosis not present

## 2018-12-28 DIAGNOSIS — F419 Anxiety disorder, unspecified: Secondary | ICD-10-CM | POA: Diagnosis present

## 2018-12-28 DIAGNOSIS — Z79899 Other long term (current) drug therapy: Secondary | ICD-10-CM | POA: Diagnosis not present

## 2018-12-28 DIAGNOSIS — J45909 Unspecified asthma, uncomplicated: Secondary | ICD-10-CM | POA: Diagnosis not present

## 2018-12-28 DIAGNOSIS — N400 Enlarged prostate without lower urinary tract symptoms: Secondary | ICD-10-CM | POA: Diagnosis not present

## 2018-12-28 DIAGNOSIS — I950 Idiopathic hypotension: Secondary | ICD-10-CM | POA: Diagnosis not present

## 2018-12-28 LAB — GLUCOSE, CAPILLARY
Glucose-Capillary: 94 mg/dL (ref 70–99)
Glucose-Capillary: 110 mg/dL — ABNORMAL HIGH (ref 70–99)
Glucose-Capillary: 158 mg/dL — ABNORMAL HIGH (ref 70–99)

## 2018-12-28 LAB — URINE CULTURE: Culture: NO GROWTH

## 2018-12-28 LAB — HEMOGLOBIN A1C
Hgb A1c MFr Bld: 6.5 % — ABNORMAL HIGH (ref 4.8–5.6)
Mean Plasma Glucose: 139.85 mg/dL

## 2018-12-28 LAB — LACTIC ACID, PLASMA
Lactic Acid, Venous: 1.4 mmol/L (ref 0.5–1.9)
Lactic Acid, Venous: 2.6 mmol/L (ref 0.5–1.9)

## 2018-12-28 LAB — SARS CORONAVIRUS 2 (TAT 6-24 HRS): SARS Coronavirus 2: NEGATIVE

## 2018-12-28 MED ORDER — ACETAMINOPHEN 650 MG RE SUPP: 650.0000 mg | Freq: Four times a day (QID) | RECTAL | Status: DC | PRN

## 2018-12-28 MED ORDER — POLYVINYL ALCOHOL 1.4 % OP SOLN
1.0000 [drp] | OPHTHALMIC | Status: DC | PRN
  Filled 2018-12-28: qty 15

## 2018-12-28 MED ORDER — DILTIAZEM HCL ER COATED BEADS 180 MG PO CP24
180.0000 mg | ORAL_CAPSULE | Freq: Every day | ORAL | Status: DC
  Administered 2018-12-28 – 2018-12-29 (×2): 180 mg via ORAL
  Filled 2018-12-28 (×2): qty 1

## 2018-12-28 MED ORDER — FINASTERIDE 5 MG PO TABS
5.0000 mg | ORAL_TABLET | Freq: Every day | ORAL | Status: DC
  Administered 2018-12-28 – 2018-12-29 (×2): 5 mg via ORAL
  Filled 2018-12-28 (×2): qty 1

## 2018-12-28 MED ORDER — LORAZEPAM 1 MG PO TABS
1.0000 mg | ORAL_TABLET | Freq: Every day | ORAL | Status: DC | PRN
  Administered 2018-12-28: 1 mg via ORAL
  Filled 2018-12-28: qty 1

## 2018-12-28 MED ORDER — ONDANSETRON HCL 4 MG/2ML IJ SOLN: 4.0000 mg | Freq: Four times a day (QID) | INTRAMUSCULAR | Status: DC | PRN

## 2018-12-28 MED ORDER — TEMAZEPAM 15 MG PO CAPS
15.0000 mg | ORAL_CAPSULE | Freq: Every day | ORAL | Status: DC
  Administered 2018-12-28: 15 mg via ORAL
  Filled 2018-12-28: qty 1

## 2018-12-28 MED ORDER — SODIUM CHLORIDE 0.9 % IV SOLN
2.0000 g | INTRAVENOUS | Status: DC
  Administered 2018-12-28: 2 g via INTRAVENOUS
  Filled 2018-12-28: qty 20
  Filled 2018-12-28: qty 2

## 2018-12-28 MED ORDER — LACTATED RINGERS IV SOLN
INTRAVENOUS | Status: DC
  Administered 2018-12-28 – 2018-12-29 (×2): via INTRAVENOUS

## 2018-12-28 MED ORDER — INSULIN ASPART 100 UNIT/ML ~~LOC~~ SOLN
0.0000 [IU] | Freq: Three times a day (TID) | SUBCUTANEOUS | Status: DC
  Administered 2018-12-29: 5 [IU] via SUBCUTANEOUS

## 2018-12-28 MED ORDER — SODIUM CHLORIDE 0.9% FLUSH
3.0000 mL | Freq: Two times a day (BID) | INTRAVENOUS | Status: DC
  Administered 2018-12-28 – 2018-12-29 (×3): 3 mL via INTRAVENOUS

## 2018-12-28 MED ORDER — OXYBUTYNIN CHLORIDE 5 MG PO TABS
5.0000 mg | ORAL_TABLET | Freq: Every day | ORAL | Status: DC
  Administered 2018-12-28 – 2018-12-29 (×2): 5 mg via ORAL
  Filled 2018-12-28 (×2): qty 1

## 2018-12-28 MED ORDER — APIXABAN 5 MG PO TABS
5.0000 mg | ORAL_TABLET | Freq: Two times a day (BID) | ORAL | Status: DC
  Administered 2018-12-28 – 2018-12-29 (×2): 5 mg via ORAL
  Filled 2018-12-28: qty 1
  Filled 2018-12-28: qty 2

## 2018-12-28 MED ORDER — ONDANSETRON HCL 4 MG PO TABS: 4.0000 mg | ORAL_TABLET | Freq: Four times a day (QID) | ORAL | Status: DC | PRN

## 2018-12-28 MED ORDER — METOPROLOL SUCCINATE ER 25 MG PO TB24
12.5000 mg | ORAL_TABLET | Freq: Every day | ORAL | Status: DC
  Administered 2018-12-29: 12.5 mg via ORAL
  Filled 2018-12-28: qty 1

## 2018-12-28 MED ORDER — TAMSULOSIN HCL 0.4 MG PO CAPS
0.4000 mg | ORAL_CAPSULE | Freq: Every day | ORAL | Status: DC
  Administered 2018-12-28: 0.4 mg via ORAL
  Filled 2018-12-28: qty 1

## 2018-12-28 MED ORDER — METRONIDAZOLE IN NACL 5-0.79 MG/ML-% IV SOLN
500.0000 mg | Freq: Three times a day (TID) | INTRAVENOUS | Status: DC
  Administered 2018-12-28 – 2018-12-29 (×4): 500 mg via INTRAVENOUS
  Filled 2018-12-28 (×3): qty 100

## 2018-12-28 MED ORDER — VANCOMYCIN HCL IN DEXTROSE 1-5 GM/200ML-% IV SOLN
1000.0000 mg | Freq: Once | INTRAVENOUS | Status: AC
  Administered 2018-12-28: 1000 mg via INTRAVENOUS
  Filled 2018-12-28: qty 200

## 2018-12-28 MED ORDER — METHOCARBAMOL 500 MG PO TABS: 750.0000 mg | ORAL_TABLET | Freq: Two times a day (BID) | ORAL | Status: DC | PRN

## 2018-12-28 MED ORDER — SODIUM CHLORIDE 0.9 % IV SOLN
INTRAVENOUS | Status: DC | PRN
  Administered 2018-12-28: 500 mL via INTRAVENOUS

## 2018-12-28 MED ORDER — GABAPENTIN 400 MG PO CAPS
1200.0000 mg | ORAL_CAPSULE | Freq: Every day | ORAL | Status: DC
  Administered 2018-12-28: 1200 mg via ORAL
  Filled 2018-12-28: qty 3

## 2018-12-28 MED ORDER — ACETAMINOPHEN 325 MG PO TABS: 650.0000 mg | ORAL_TABLET | Freq: Four times a day (QID) | ORAL | Status: DC | PRN

## 2018-12-28 NOTE — H&P (Addendum)
History and Physical    Jonathan Gordon [id number] DOB: 06-05-1931 DOA: 12/27/2018  PCP:  [facility] Consultants:  Luana Shu - orthopedics; Bland Span - neurology; Cheek - cardiology; Columbano- pain management; Cousins - eye ; Pendergraft-urology; Jorizzo - dermatology Patient coming from:  Home - lives with wife; NOK: Daughters or son, [phone]  Chief Complaint: dizziness  HPI: Malakia Hi is a 85 y.o. male with medical history significant of HTN; BPH; and afib presenting with dizziness and hypotension.  He reports counseling yesterday, felt fine.  His wife is ill and he went to [facility] and felt dizzy.  He drove home, got there about 430 and he was extremely pale when he got home. BP 76/42.  His wife's caregiver called his daughter, he refused 911 due to cost.  BP on arrival in the ER was very low, his heart was racing, he was SOB.  He had a couple of BMs at the ER, nauseated, and quivering.  He feels great now, felt like going home last night.  He really can't figure out why this happened.  He felt great in the morning.  He does have some anxiety, having caregiver issues, lots of stress, COVID is more stress, eating out despite being told not to.  In review of his medications, it appears that his Toprol XL was increased from 12.5 to 25 mg daily starting yesterday for the first time.   ED Course: [facility] to [facility] transfer, per Dr. Alcario Drought:  85 yo M with near syncope, BPs in the 70s/30s initially, Lactate 3.5.  Being treated as sepsis by EDP with cefepime and vanc.  Will put in for SDU bed but no SDUs available so probably will be over at [facility] all night.  EKG a.fib and bradycardia.  Update: BP remains stable for several hours now, will switch bed request to tele.  Review of Systems: As per HPI; otherwise review of systems reviewed and negative.   Ambulatory Status:  Ambulates without assistance, recently with orthopedic issues  Past Medical History:  Diagnosis Date    Actinic keratoses    Arthritis    Asthma    Atrial fibrillation, chronic (HCC) 06/11/2017   Eliquis   Back pain    BPH (benign prostatic hyperplasia)    Cancer (HCC)    skin   GERD (gastroesophageal reflux disease)    Hypertension    Neck pain    Neuropathy    Pseudophakia    Pulmonary nodule    Renal disorder    kidney stone    Past Surgical History:  Procedure Laterality Date   BAND HEMORRHOIDECTOMY     CARDIAC CATHETERIZATION     HEMORRHOID SURGERY      Social History   Socioeconomic History   Marital status: Married    Spouse name: Not on file   Number of children: Not on file   Years of education: Not on file   Highest education level: Not on file  Occupational History   Not on file  Social Needs   Financial resource strain: Not on file   Food insecurity    Worry: Not on file    Inability: Not on file   Transportation needs    Medical: Not on file    Non-medical: Not on file  Tobacco Use   Smoking status: Never Smoker   Smokeless tobacco: Never Used  Substance and Sexual Activity   Alcohol use: No   Drug use: No   Sexual activity: Not on file  Lifestyle  Physical activity    Days per week: Not on file    Minutes per session: Not on file   Stress: Not on file  Relationships   Social connections    Talks on phone: Not on file    Gets together: Not on file    Attends religious service: Not on file    Active member of club or organization: Not on file    Attends meetings of clubs or organizations: Not on file    Relationship status: Not on file   Intimate partner violence    Fear of current or ex partner: Not on file    Emotionally abused: Not on file    Physically abused: Not on file    Forced sexual activity: Not on file  Other Topics Concern   Not on file  Social History Narrative   Not on file    Allergies  Allergen Reactions   Hydromorphone Itching   Coumadin [Warfarin Sodium] Hives    Hydrocodone-Acetaminophen Itching   Oxycontin [Oxycodone Hcl] Itching   Xarelto [Rivaroxaban] Hives    History reviewed. No pertinent family history.  Prior to Admission medications   Medication Sig Start Date End Date Taking? Authorizing Provider  apixaban (ELIQUIS) 5 MG TABS tablet Take 5 mg by mouth 2 (two) times daily.    [provider]  cephALEXin (KEFLEX) 500 MG capsule Take 500 mg by mouth 2 (two) times daily.    [provider]  diclofenac Sodium (VOLTAREN) 1 % GEL Apply 1 application topically 3 (three) times daily as needed for muscle pain. 11/17/18   [provider]  diltiazem (CARDIZEM CD) 180 MG 24 hr capsule Take 180 mg by mouth daily. 10/28/18   [provider]  diltiazem (DILACOR XR) 180 MG 24 hr capsule Take 180 mg by mouth daily.    [provider]  Ergocalciferol (VITAMIN D2) 2000 units TABS Take 2,000 Units by mouth daily.     [provider]  finasteride (PROSCAR) 5 MG tablet  06/01/18   [provider]  gabapentin (NEURONTIN) 300 MG capsule Take 900 mg by mouth at bedtime.  02/23/17   [provider]  ibuprofen (ADVIL,MOTRIN) 800 MG tablet Take 800 mg by mouth every 8 (eight) hours as needed for mild pain.     [provider]  LORazepam (ATIVAN) 1 MG tablet Take 1 mg by mouth daily as needed for anxiety.    [provider]  Magnesium 250 MG TABS Take 250 mg by mouth daily.     [provider]  methocarbamol (ROBAXIN) 750 MG tablet Take 750 mg by mouth 2 (two) times daily. 12/16/18   [provider]  metoprolol succinate (TOPROL-XL) 25 MG 24 hr tablet Take 25 mg by mouth daily. Pt sts dose increased to "a whole pill" 1 yr ago    [provider]  Multiple Vitamins-Minerals (CENTRUM SILVER PO) Take 1 tablet by mouth daily.    [provider]  oxybutynin (DITROPAN) 5 MG tablet Take 5 mg by mouth daily. 12/14/18   [provider]  tamsulosin  (FLOMAX) 0.4 MG CAPS capsule  06/01/18   [provider]  temazepam (RESTORIL) 15 MG capsule Take 15 mg by mouth at bedtime as needed for sleep. 12/27/18   [provider]  Vitamins-Lipotropics (SUPER B-50 COMPLEX PLUS PO) Take 1 tablet by mouth daily.    [provider]    Physical Exam: Vitals:   12/28/18 0915 12/28/18 0930 12/28/18 0935 12/28/18  1044  BP: 120/81 125/84  121/71  Pulse:  76  65  Resp: 13 15  18   Temp:   97.9 F (36.6 C) 98.1 F (36.7 C)  TempSrc:   Oral Oral  SpO2:  97%  97%  Weight:      Height:          General:  Appears calm and comfortable and is NAD  Eyes:  EOMI, normal lids, iris  ENT:  grossly normal hearing, lips & tongue, mmm; appropriate dentition  Neck:  no LAD, masses or thyromegaly; no carotid bruits  Cardiovascular:  RRR, no m/r/g. No LE edema.   Respiratory:   CTA bilaterally with no wheezes/rales/rhonchi.  Normal respiratory effort.  Abdomen:  soft, very mild LLQ TTP, ND, NABS  Skin:  no rash or induration seen on limited exam  Musculoskeletal:  grossly normal tone BUE/BLE, good ROM, no bony abnormality  Psychiatric:  grossly normal mood and affect, speech fluent and appropriate, AOx3  Neurologic:  CN 2-12 grossly intact, moves all extremities in coordinated fashion, sensation intact    Radiological Exams on Admission: Ct Abdomen Pelvis W Contrast  Result Date: 12/27/2018 CLINICAL DATA:  Abdominal pain EXAM: CT ABDOMEN AND PELVIS WITH CONTRAST TECHNIQUE: Multidetector CT imaging of the abdomen and pelvis was performed using the standard protocol following bolus administration of intravenous contrast. CONTRAST:  152mL OMNIPAQUE IOHEXOL 300 MG/ML  SOLN COMPARISON:  September 14, 2018 FINDINGS: Lower chest: The visualized heart size within normal limits. No pericardial fluid/thickening. No hiatal hernia. The visualized portions of the lungs are clear. Hepatobiliary: There is a small 7 mm low-density lesion seen in  the anterior left liver lobe as on prior exam.The main portal vein is patent. No evidence of calcified gallstones, gallbladder wall thickening or biliary dilatation. Pancreas: Unremarkable. No pancreatic ductal dilatation or surrounding inflammatory changes. Spleen: Normal in size without focal abnormality. Adrenals/Urinary Tract: Both adrenal glands appear normal. Bilateral low-density lesions are seen within both kidneys the largest measuring 4 cm in the upper pole of the left kidney, likely renal cyst. No hydronephrosis is seen. No perinephric stranding. The bladder is partially distended. Stomach/Bowel: The stomach and small bowel are normal in appearance. Scattered colonic diverticula are noted without diverticulitis. The appendix is unremarkable. Vascular/Lymphatic: There are no enlarged mesenteric, retroperitoneal, or pelvic lymph nodes. Scattered aortic atherosclerotic calcifications are seen without aneurysmal dilatation. Reproductive: Coarse calcifications again noted within the prostate gland. Other: Small fat containing inguinal hernias are noted. Musculoskeletal: No acute or significant osseous findings. IMPRESSION: No acute intra-abdominal or pelvic pathology to explain the patient's symptoms. Aortic Atherosclerosis (ICD10-I70.0). Bilateral renal cysts Diverticulosis without diverticulitis. Electronically Signed   By: Prudencio Pair M.D.   On: 12/27/2018 22:00   Dg Chest Portable 1 View  Result Date: 12/27/2018 CLINICAL DATA:  Lightheaded EXAM: PORTABLE CHEST 1 VIEW COMPARISON:  11/17/2012 FINDINGS: Heart and mediastinal contours are within normal limits. No focal opacities or effusions. No acute bony abnormality. IMPRESSION: No active disease. Electronically Signed   By: Rolm Baptise M.D.   On: 12/27/2018 21:58    EKG: Independently reviewed.  Bradycardia with rate 42; no evidence of acute ischemia   Labs on Admission: I have personally reviewed the available labs and imaging studies at the  time of the admission.  Pertinent labs:   Glucose 221 HS troponin <2 x 2 Lactate 3.5, 3.1 WBC 11.0 TSH 3.426 INR 1.4 Normal UA Blood cultures pending COVID negative (Hologic)  Assessment/Plan Principal Problem:   Hypotension  Active Problems:   OSA (obstructive sleep apnea)   Left lower quadrant pain   Atrial fibrillation, chronic (HCC)   Hyperglycemia   Lactic acidosis   Hypotension -Etiology is not clear. The differential diagnosis is broad, including vasovagal syncope, seizure, TIA/stroke, arrhythmia, ACS, alcohol intoxication, drug abuse, orthostatic status, carotid artery stenosis, sepsis -He did apparently double his dose of Toprol XL for the first time yesterday and this could be related given his persistent bradycardia and hypotension; will hold today and resume at 12.5 mg daily dose tomorrow -Will monitor on telemetry overnight -Orthostatic vital signs in AM -Continuing to monitor for sepsis, will recheck lactate - but lactate is likely elevated related to profound and persistent hypotension rather than sepsis given his overall presentation -His vitals have normalized today and he feels well -If no further issues, likely appropriate for d/c to home tomorrow  LLQ pain -Diverticulosis without -itis appreciated on CT -He does have LLQ TTP -He was given broad-spectrum antibiotics for undifferentiated sepsis yesterday -Will transition to Rocephin/Flagyl for empiric diverticulitis coverage today and f/u cultures  Afib -Rate controlled with Toprol XL; this dose was increased yesterday, ?related to presenting complaint -Will resume prior dose of Toprol  Lactic acidosis -As noted above, lower likelihood of sepsis based on current information -More likely associated with persistent hypotension -Will recheck now  OSA -Will order CPAP  Hyperglycemia -May be stress response -Will follow with fasting AM labs and check A1c -Will cover with moderate-scale SSI at this  time  Life stressors -His wife is dying in their home from dementia -He is struggling with caregiver issues -He also feels trapped within his home due to [place] -This may have played a role in yesterday's events  -Ongoing support/counseling encouraged    Note: This patient has been tested and is negative for the novel coronavirus COVID-19.  DVT prophylaxis: Eliquis Code Status:  DNR - confirmed with patient/family Family Communication: Son was present throughout evaluation Disposition Plan:  Home once clinically improved Consults called: None  Admission status: Admit - It is my clinical opinion that admission to INPATIENT is reasonable and necessary because of the expectation that this patient will require hospital care that crosses at least 2 midnights to treat this condition based on the medical complexity of the problems presented.  Given the aforementioned information, the predictability of an adverse outcome is felt to be significant.    Karmen Bongo MD [facility]   How to contact the [facility] Attending or Consulting provider [place] [facility] or covering provider during after hours [place], for this patient?  1. Check the care team in [facility] and look for a) attending/consulting [facility] provider listed and b) the [facility] team listed 2. Log into [email or web address] and use [place]'s universal password to access. If you do not have the password, please contact the hospital operator. 3. Locate the [facility] provider you are looking for under [facility] and page to a number that you can be directly reached. 4. If you still have difficulty reaching the provider, please page the [facility] (Director on Call) for the Hospitalists listed on amion for assistance.   12/28/2018, 11:58 AM

## 2018-12-28 NOTE — ED Notes (Signed)
[facility] notified (Tara) - patient ready for transport 

## 2018-12-28 NOTE — ED Notes (Signed)
Doug, pt's son, is notified of the room number and this transfer.

## 2018-12-28 NOTE — Progress Notes (Signed)
CRITICAL VALUE ALERT  Critical Value:  Lactic Acid 2.6  Date & Time Notied: 12/28/2018 at 1540  Provider Notified: yes  Orders Received/Actions taken: awaiting any new orders

## 2018-12-28 NOTE — ED Notes (Signed)
Noted that Vancomycin 1500 mg was not given at 2145.  No available dose in our pyxis.  EDP made aware.  No dose order noted.

## 2018-12-29 DIAGNOSIS — I950 Idiopathic hypotension: Secondary | ICD-10-CM

## 2018-12-29 LAB — CBC
HCT: 46.4 % (ref 39.0–52.0)
Hemoglobin: 14.8 g/dL (ref 13.0–17.0)
MCH: 30 pg (ref 26.0–34.0)
MCHC: 31.9 g/dL (ref 30.0–36.0)
MCV: 94.1 fL (ref 80.0–100.0)
Platelets: 155 10*3/uL (ref 150–400)
RBC: 4.93 MIL/uL (ref 4.22–5.81)
RDW: 13.2 % (ref 11.5–15.5)
WBC: 5.9 10*3/uL (ref 4.0–10.5)
nRBC: 0 % (ref 0.0–0.2)

## 2018-12-29 LAB — BASIC METABOLIC PANEL
Anion gap: 9 (ref 5–15)
BUN: 12 mg/dL (ref 8–23)
CO2: 23 mmol/L (ref 22–32)
Calcium: 8.2 mg/dL — ABNORMAL LOW (ref 8.9–10.3)
Chloride: 109 mmol/L (ref 98–111)
Creatinine, Ser: 0.83 mg/dL (ref 0.61–1.24)
GFR calc Af Amer: >60 mL/min (ref >60)
GFR calc non Af Amer: >60 mL/min (ref >60)
Glucose, Bld: 126 mg/dL — ABNORMAL HIGH (ref 70–99)
Potassium: 3.9 mmol/L (ref 3.5–5.1)
Sodium: 141 mmol/L (ref 135–145)

## 2018-12-29 LAB — LACTIC ACID, PLASMA: Lactic Acid, Venous: 0.9 mmol/L (ref 0.5–1.9)

## 2018-12-29 LAB — GLUCOSE, CAPILLARY
Glucose-Capillary: 115 mg/dL — ABNORMAL HIGH (ref 70–99)
Glucose-Capillary: 201 mg/dL — ABNORMAL HIGH (ref 70–99)

## 2018-12-29 MED ORDER — CEPHALEXIN 500 MG PO CAPS: 500.0000 mg | ORAL_CAPSULE | Freq: Four times a day (QID) | ORAL | 0 refills | Status: AC

## 2018-12-29 MED ORDER — TAMSULOSIN HCL 0.4 MG PO CAPS: 0.4000 mg | ORAL_CAPSULE | Freq: Every day | ORAL | 0 refills | Status: AC

## 2018-12-29 MED ORDER — METOPROLOL SUCCINATE ER 25 MG PO TB24: 12.5000 mg | ORAL_TABLET | Freq: Every day | ORAL | Status: AC

## 2018-12-29 NOTE — Discharge Summary (Signed)
Physician Discharge Summary  Kanta Hartke [id number] DOB: Nov 16, 1931 DOA: 12/27/2018  PCP: Patient, No Pcp Per  Admit date: 12/27/2018 Discharge date: 12/29/2018  Admitted From: Home Discharge disposition: Home   Code Status: DNR  Diet Recommendation: Cardiac/diabetic diet   Recommendations for Outpatient Follow-Up:   1. Follow-up with PCP as an outpatient.  Needs to repeat A1c in next 4 to 6 months 2. Follow-up with cardiology as an outpatient  Discharge Diagnosis:   Principal Problem:   Hypotension Active Problems:   OSA (obstructive sleep apnea)   Left lower quadrant pain   Atrial fibrillation, chronic (HCC)   Hyperglycemia   Lactic acidosis    History of Present Illness / Brief narrative:  Jonathan Gordon is a 85 y.o. male with medical history significant of HTN; BPH; and Afib on metoprolol and Eliquis for a long time with no history of stroke.  In his last follow-up with his primary care provider, for some reason patient was asked to double up the dose of Toprol from 12.5 mg to 25 mg daily.  Patient is unable to tell me what exactly was the reason.  Patient took the first dose of Toprol 25 mg on 11/30.  Later in the afternoon, patient started feeling weak.  He was extremely pale and found his blood pressure to be low at 76/42.  Patient was brought to the [facility] for same.  BP on arrival in the ER was low, his heart was racing, he was SOB.  He had a couple of BMs at the ER, nauseated, and quivering.    Patient is providing care to his wife who has terminal stage of dementia.  He endorses some anxiety, caregiver issues and lots of stress.   Patient also states that for last 1 month, he has been having burning sensation towards the end of urination.  His primary care physician started him on finasteride and Flomax.  He still has those symptoms.  Work-up in the ED showed lactic acid level elevated to 3.5. Urinalysis showed clear yellow urine with no  leukocytes. No source of infection was evident, but with high degree of suspicion, patient was started on treatment at sepsis.  IV fluids and broad-spectrum antibiotics were started. Next few hours of admission, patient became hemodynamically stable.  Hospital Course:  Hypotension Dizziness  lactic acidosis -In retrospect, patient symptoms and presentation were likely related to increased dose of Toprol causing bradycardia, hypertension, dizziness and lactic acidosis.   -Toprol dose was reduced back to 12.5 mg daily.   -He was initially suspected of having sepsis and given broad-spectrum antibiotics and IV fluid.  Sepsis ruled out.  IV antibiotics stopped.  Adequately hydrated.  Lactic acid normalized today. -Blood pressure stabilized.  Negative for orthostatic blood pressure check today.   -Patient is good for discharge to home today, no need of antibiotics at discharge.    LLQ pain -Diverticulosis without -itis appreciated on CT -No complaint of such pain today.  Burning urination -Patient states that for last 1 month, he has been having burning sensation towards the end of urination.  His primary care physician started him on finasteride and Flomax.  He still has those symptoms.  He was started on IV Rocephin on admission yesterday.  Urinalysis did not show any evidence of infection.  Patient is frustrated with his symptoms and is agreeable to try a short course of antibiotics.  I wrote him for 5 days of Keflex as a trial.  Patient states  that he knows our urologist and will call to make an appointment with them.  Afib -Has A. fib for a long time and controlled on Toprol and diltiazem.  Follows up with cardiologist Dr. Atilano Median at [place] same dose of metoprolol and Cardizem for now.  Follow-up with PCP in cardiologist as an outpatient.  OSA -CPAP  New onset diabetes mellitus  -Patient technically meets criteria for diabetes mellitus with hemoglobin A1c at 6.5.     -Patient believes his sugar levels are high because of high level of stress.   -Recommended lifestyle changes with diet control and exercise.   -Follow-up with primary care provider for repeat A1c testing in next 4 to 6 months.    Stable for discharge to home today. Subjective:  Seen and examined this morning.  Pleasant elderly Caucasian male.  Sitting up in chair.  Not in distress.  Feels better this morning.  Discharge Exam:   Vitals:   12/29/18 0806 12/29/18 1119 12/29/18 1135 12/29/18 1220  BP:  112/79 129/73 123/75  Pulse:  78 79 79  Resp:      Temp:      TempSrc:      SpO2: 96%     Weight:      Height:        Body mass index is 27.44 kg/m.  General exam: Appears calm and comfortable.  No distress Skin: No rashes, lesions or ulcers. HEENT: Atraumatic, normocephalic, supple neck, no obvious bleeding Lungs: Clear to auscultation bilaterally CVS: Rate controlled A. fib, no murmur GI/Abd soft, nontender, nondistended, bowel sound present CNS: Alert, awake, oriented x3 Psychiatry: Mood appropriate Extremities: No pedal edema, no calf tenderness  Discharge Instructions:  Wound care: None Discharge Instructions    Diet Carb Modified   Complete by: As directed    Increase activity slowly   Complete by: As directed      Follow-up Information    Cheek, Gayleen Orem, MD Follow up.   Specialty: Cardiology Contact information: [address] [place] [address] [phone]          Allergies as of 12/29/2018      Reactions   Hydromorphone Itching   Coumadin [warfarin Sodium] Hives   Hydrocodone-acetaminophen Itching   Oxycontin [oxycodone Hcl] Itching   Xarelto [rivaroxaban] Hives      Medication List    STOP taking these medications   lidocaine 4 % cream Commonly known as: LMX   LORazepam 1 MG tablet Commonly known as: ATIVAN   methocarbamol 750 MG tablet Commonly known as: ROBAXIN     TAKE these medications   apixaban 5 MG Tabs  tablet Commonly known as: ELIQUIS Take 5 mg by mouth 2 (two) times daily.   cephALEXin 500 MG capsule Commonly known as: KEFLEX Take 1 capsule (500 mg total) by mouth 4 (four) times daily for 5 days.   CRANBERRY JUICE EXTRACT PO Take 1 Bottle by mouth daily.   diltiazem 180 MG 24 hr capsule Commonly known as: CARDIZEM CD Take 180 mg by mouth daily.   finasteride 5 MG tablet Commonly known as: PROSCAR Take 5 mg by mouth daily.   gabapentin 300 MG capsule Commonly known as: NEURONTIN Take 1,200 mg by mouth at bedtime.   metoprolol succinate 25 MG 24 hr tablet Commonly known as: TOPROL-XL Take 0.5 tablets (12.5 mg total) by mouth daily. What changed: how much to take   OVER THE COUNTER MEDICATION Take 1 Bar by mouth daily. Protein bar  everyday   oxybutynin 5 MG tablet Commonly known as: DITROPAN Take 5 mg by mouth daily.   polyvinyl alcohol 1.4 % ophthalmic solution Commonly known as: LIQUIFILM TEARS Place 1 drop into both eyes as needed for dry eyes.   tamsulosin 0.4 MG Caps capsule Commonly known as: FLOMAX Take 0.4 mg by mouth daily after supper.   temazepam 15 MG capsule Commonly known as: RESTORIL Take 15 mg by mouth at bedtime.       Time coordinating discharge: 35 minutes  The results of significant diagnostics from this hospitalization (including imaging, microbiology, ancillary and laboratory) are listed below for reference.    Procedures and Diagnostic Studies:   Ct Abdomen Pelvis W Contrast  Result Date: 12/27/2018 CLINICAL DATA:  Abdominal pain EXAM: CT ABDOMEN AND PELVIS WITH CONTRAST TECHNIQUE: Multidetector CT imaging of the abdomen and pelvis was performed using the standard protocol following bolus administration of intravenous contrast. CONTRAST:  19mL OMNIPAQUE IOHEXOL 300 MG/ML  SOLN COMPARISON:  September 14, 2018 FINDINGS: Lower chest: The visualized heart size within normal limits. No pericardial fluid/thickening. No hiatal hernia. The  visualized portions of the lungs are clear. Hepatobiliary: There is a small 7 mm low-density lesion seen in the anterior left liver lobe as on prior exam.The main portal vein is patent. No evidence of calcified gallstones, gallbladder wall thickening or biliary dilatation. Pancreas: Unremarkable. No pancreatic ductal dilatation or surrounding inflammatory changes. Spleen: Normal in size without focal abnormality. Adrenals/Urinary Tract: Both adrenal glands appear normal. Bilateral low-density lesions are seen within both kidneys the largest measuring 4 cm in the upper pole of the left kidney, likely renal cyst. No hydronephrosis is seen. No perinephric stranding. The bladder is partially distended. Stomach/Bowel: The stomach and small bowel are normal in appearance. Scattered colonic diverticula are noted without diverticulitis. The appendix is unremarkable. Vascular/Lymphatic: There are no enlarged mesenteric, retroperitoneal, or pelvic lymph nodes. Scattered aortic atherosclerotic calcifications are seen without aneurysmal dilatation. Reproductive: Coarse calcifications again noted within the prostate gland. Other: Small fat containing inguinal hernias are noted. Musculoskeletal: No acute or significant osseous findings. IMPRESSION: No acute intra-abdominal or pelvic pathology to explain the patient's symptoms. Aortic Atherosclerosis (ICD10-I70.0). Bilateral renal cysts Diverticulosis without diverticulitis. Electronically Signed   By: Prudencio Pair M.D.   On: 12/27/2018 22:00   Dg Chest Portable 1 View  Result Date: 12/27/2018 CLINICAL DATA:  Lightheaded EXAM: PORTABLE CHEST 1 VIEW COMPARISON:  11/17/2012 FINDINGS: Heart and mediastinal contours are within normal limits. No focal opacities or effusions. No acute bony abnormality. IMPRESSION: No active disease. Electronically Signed   By: Rolm Baptise M.D.   On: 12/27/2018 21:58     Labs:   Basic Metabolic Panel: Recent Labs  Lab 12/27/18 2034  12/29/18 0427  NA 136 141  K 4.1 3.9  CL 104 109  CO2 21* 23  GLUCOSE 221* 126*  BUN 18 12  CREATININE 1.06 0.83  CALCIUM 8.5* 8.2*   GFR Estimated Creatinine Clearance: 63 mL/min (by C-G formula based on SCr of 0.83 mg/dL). Liver Function Tests: Recent Labs  Lab 12/27/18 2034  AST 14*  ALT 12  ALKPHOS 75  BILITOT 1.0  PROT 6.2*  ALBUMIN 4.0   Recent Labs  Lab 12/27/18 2034  LIPASE 22   No results for input(s): AMMONIA in the last 168 hours. Coagulation profile Recent Labs  Lab 12/27/18 2036  INR 1.4*    CBC: Recent Labs  Lab 12/27/18 2034 12/29/18 0427  WBC 11.0* 5.9  NEUTROABS  9.1*  --   HGB 15.6 14.8  HCT 47.3 46.4  MCV 91.7 94.1  PLT 215 155   Cardiac Enzymes: No results for input(s): CKTOTAL, CKMB, CKMBINDEX, TROPONINI in the last 168 hours. BNP: Invalid input(s): POCBNP CBG: Recent Labs  Lab 12/28/18 1156 12/28/18 1708 12/28/18 2006 12/29/18 0854 12/29/18 1225  GLUCAP 94 110* 158* 115* 201*   D-Dimer No results for input(s): DDIMER in the last 72 hours. Hgb A1c Recent Labs    12/28/18 1222  HGBA1C 6.5*   Lipid Profile No results for input(s): CHOL, HDL, LDLCALC, TRIG, CHOLHDL, LDLDIRECT in the last 72 hours. Thyroid function studies Recent Labs    12/27/18 2034  TSH 3.426   Anemia work up No results for input(s): VITAMINB12, FOLATE, FERRITIN, TIBC, IRON, RETICCTPCT in the last 72 hours. Microbiology Recent Results (from the past 240 hour(s))  Blood culture (routine x 2)     Status: None (Preliminary result)   Collection Time: 12/27/18  8:24 PM   Specimen: BLOOD  Result Value Ref Range Status   Specimen Description   Final    BLOOD LEFT ANTECUBITAL Performed at [facility], [address] [place], [place] [address]    Special Requests   Final    BOTTLES DRAWN AEROBIC AND ANAEROBIC Blood Culture adequate volume Performed at [facility], [address] [place], [place] [address]    Culture    Final    NO GROWTH 2 DAYS Performed at [facility], [place] [address], [place], [place] [address]    Report Status PENDING  Incomplete  Blood culture (routine x 2)     Status: None (Preliminary result)   Collection Time: 12/27/18  8:37 PM   Specimen: BLOOD  Result Value Ref Range Status   Specimen Description   Final    BLOOD RIGHT ANTECUBITAL Performed at [facility], [address] [place], [place] [address]    Special Requests   Final    BOTTLES DRAWN AEROBIC AND ANAEROBIC Blood Culture adequate volume Performed at [facility], [address] [place], [place] [address]    Culture   Final    NO GROWTH 2 DAYS Performed at [facility], [place] [address] [place], [place] [address]    Report Status PENDING  Incomplete  SARS CORONAVIRUS 2 (TAT 6-24 HRS) Nasopharyngeal Nasopharyngeal Swab     Status: None   Collection Time: 12/27/18 10:12 PM   Specimen: Nasopharyngeal Swab  Result Value Ref Range Status   SARS Coronavirus 2 NEGATIVE NEGATIVE Final    Comment: (NOTE) SARS-CoV-2 target nucleic acids are NOT DETECTED. The SARS-CoV-2 RNA is generally detectable in upper and lower respiratory specimens during the acute phase of infection. Negative results do not preclude SARS-CoV-2 infection, do not rule out co-infections with other pathogens, and should not be used as the sole basis for treatment or other patient management decisions. Negative results must be combined with clinical observations, patient history, and epidemiological information. The expected result is Negative. Fact Sheet for Patients: SugarRoll.be Fact Sheet for Healthcare Providers: https://www.woods-mathews.com/ This test is not yet approved or cleared by the [place] FDA and  has been authorized for detection and/or diagnosis of SARS-CoV-2 by FDA under an Emergency Use Authorization (EUA). This EUA will remain  in effect (meaning  this test can be used) for the duration of the COVID-19 declaration under Section 56 4(b)(1) of the Act, 21 U.S.C. section  360bbb-3(b)(1), unless the authorization is terminated or revoked sooner. Performed at [facility], [place] [address] [place], [place] [address]   Urine culture     Status: None   Collection Time: 12/27/18 10:38 PM   Specimen: Urine, Random  Result Value Ref Range Status   Specimen Description   Final    URINE, RANDOM Performed at [facility], [address] [place], [place] [address]    Special Requests   Final    NONE Performed at [facility], [address] [place], [place] [address]    Culture   Final    NO GROWTH Performed at [facility], [place] [address], [place], [place] [address]    Report Status 12/28/2018 FINAL  Final    Please note: You were cared for by a hospitalist during your hospital stay. Once you are discharged, your primary care physician will handle any further medical issues. Please note that NO REFILLS for any discharge medications will be authorized once you are discharged, as it is imperative that you return to your primary care physician (or establish a relationship with a primary care physician if you do not have one) for your post hospital discharge needs so that they can reassess your need for medications and monitor your lab values.  Signed: Terrilee Croak  [facility] 12/29/2018, 12:44 PM

## 2018-12-29 NOTE — Progress Notes (Signed)
Patient ambulated 180 feet without c/o dizziness. BP pre-ambulation was 112/79, post-ambulation was 129/73. Tolerated well. Md notified. Eulas Post, RN

## 2019-01-01 LAB — CULTURE, BLOOD (ROUTINE X 2)
Culture: NO GROWTH
Culture: NO GROWTH
Special Requests: ADEQUATE
Special Requests: ADEQUATE

## 2019-01-03 DIAGNOSIS — N401 Enlarged prostate with lower urinary tract symptoms: Secondary | ICD-10-CM | POA: Diagnosis not present

## 2019-01-03 DIAGNOSIS — R300 Dysuria: Secondary | ICD-10-CM | POA: Diagnosis not present

## 2019-01-03 DIAGNOSIS — N3941 Urge incontinence: Secondary | ICD-10-CM | POA: Diagnosis not present

## 2019-01-03 DIAGNOSIS — R350 Frequency of micturition: Secondary | ICD-10-CM | POA: Diagnosis not present

## 2019-01-04 DIAGNOSIS — M1710 Unilateral primary osteoarthritis, unspecified knee: Secondary | ICD-10-CM | POA: Diagnosis not present

## 2019-01-04 DIAGNOSIS — Z961 Presence of intraocular lens: Secondary | ICD-10-CM | POA: Diagnosis not present

## 2019-01-04 DIAGNOSIS — M170 Bilateral primary osteoarthritis of knee: Secondary | ICD-10-CM | POA: Diagnosis not present

## 2019-01-04 DIAGNOSIS — Z885 Allergy status to narcotic agent status: Secondary | ICD-10-CM | POA: Diagnosis not present

## 2019-01-04 DIAGNOSIS — M47812 Spondylosis without myelopathy or radiculopathy, cervical region: Secondary | ICD-10-CM | POA: Diagnosis not present

## 2019-01-04 DIAGNOSIS — H35352 Cystoid macular degeneration, left eye: Secondary | ICD-10-CM | POA: Diagnosis not present

## 2019-01-04 DIAGNOSIS — M25561 Pain in right knee: Secondary | ICD-10-CM | POA: Diagnosis not present

## 2019-01-04 DIAGNOSIS — N400 Enlarged prostate without lower urinary tract symptoms: Secondary | ICD-10-CM | POA: Diagnosis not present

## 2019-01-04 DIAGNOSIS — H348122 Central retinal vein occlusion, left eye, stable: Secondary | ICD-10-CM | POA: Diagnosis not present

## 2019-01-04 DIAGNOSIS — Z0100 Encounter for examination of eyes and vision without abnormal findings: Secondary | ICD-10-CM | POA: Diagnosis not present

## 2019-01-04 DIAGNOSIS — M5442 Lumbago with sciatica, left side: Secondary | ICD-10-CM | POA: Diagnosis not present

## 2019-01-04 DIAGNOSIS — H43813 Vitreous degeneration, bilateral: Secondary | ICD-10-CM | POA: Diagnosis not present

## 2019-01-04 DIAGNOSIS — I959 Hypotension, unspecified: Secondary | ICD-10-CM | POA: Diagnosis not present

## 2019-01-04 DIAGNOSIS — Z888 Allergy status to other drugs, medicaments and biological substances status: Secondary | ICD-10-CM | POA: Diagnosis not present

## 2019-01-04 DIAGNOSIS — H26491 Other secondary cataract, right eye: Secondary | ICD-10-CM | POA: Diagnosis not present

## 2019-01-04 DIAGNOSIS — M25552 Pain in left hip: Secondary | ICD-10-CM | POA: Diagnosis not present

## 2019-01-04 DIAGNOSIS — E119 Type 2 diabetes mellitus without complications: Secondary | ICD-10-CM | POA: Diagnosis not present

## 2019-01-04 DIAGNOSIS — M542 Cervicalgia: Secondary | ICD-10-CM | POA: Diagnosis not present

## 2019-09-05 ENCOUNTER — Emergency Department (HOSPITAL_BASED_OUTPATIENT_CLINIC_OR_DEPARTMENT_OTHER)
Admission: EM | Admit: 2019-09-05 | Discharge: 2019-09-06 | Disposition: A | Discharge status: Home / Self Care | Payer: Medicare PPO | Attending: Emergency Medicine | Admitting: Emergency Medicine

## 2019-09-05 ENCOUNTER — Encounter (HOSPITAL_BASED_OUTPATIENT_CLINIC_OR_DEPARTMENT_OTHER): Payer: Self-pay | Admitting: *Deleted

## 2019-09-05 ENCOUNTER — Other Ambulatory Visit: Payer: Self-pay

## 2019-09-05 DIAGNOSIS — Z85828 Personal history of other malignant neoplasm of skin: Secondary | ICD-10-CM | POA: Insufficient documentation

## 2019-09-05 DIAGNOSIS — Y939 Activity, unspecified: Secondary | ICD-10-CM | POA: Insufficient documentation

## 2019-09-05 DIAGNOSIS — T148XXA Other injury of unspecified body region, initial encounter: Secondary | ICD-10-CM

## 2019-09-05 DIAGNOSIS — M79604 Pain in right leg: Secondary | ICD-10-CM | POA: Diagnosis present

## 2019-09-05 DIAGNOSIS — Y929 Unspecified place or not applicable: Secondary | ICD-10-CM | POA: Diagnosis not present

## 2019-09-05 DIAGNOSIS — T1490XA Injury, unspecified, initial encounter: Secondary | ICD-10-CM | POA: Diagnosis not present

## 2019-09-05 DIAGNOSIS — Z79899 Other long term (current) drug therapy: Secondary | ICD-10-CM | POA: Insufficient documentation

## 2019-09-05 DIAGNOSIS — J45909 Unspecified asthma, uncomplicated: Secondary | ICD-10-CM | POA: Insufficient documentation

## 2019-09-05 DIAGNOSIS — I10 Essential (primary) hypertension: Secondary | ICD-10-CM | POA: Diagnosis not present

## 2019-09-05 DIAGNOSIS — X58XXXA Exposure to other specified factors, initial encounter: Secondary | ICD-10-CM | POA: Insufficient documentation

## 2019-09-05 DIAGNOSIS — Y999 Unspecified external cause status: Secondary | ICD-10-CM | POA: Diagnosis not present

## 2019-09-05 NOTE — ED Triage Notes (Signed)
Pain in his right upper leg x 10 days. Pain is getting worse.

## 2019-09-06 ENCOUNTER — Emergency Department (HOSPITAL_BASED_OUTPATIENT_CLINIC_OR_DEPARTMENT_OTHER): Payer: Medicare PPO

## 2019-09-06 MED ORDER — TRAMADOL HCL 50 MG PO TABS: 50.0000 mg | ORAL_TABLET | Freq: Three times a day (TID) | ORAL | 0 refills | Status: AC | PRN

## 2019-09-06 MED ORDER — TRAMADOL HCL 50 MG PO TABS
50.0000 mg | ORAL_TABLET | Freq: Once | ORAL | Status: AC
  Administered 2019-09-06: 50 mg via ORAL
  Filled 2019-09-06: qty 1

## 2019-09-06 NOTE — ED Provider Notes (Signed)
[facility] EMERGENCY DEPARTMENT Provider Note   CSN: [id number] Arrival date & time: 09/05/19  1927     History Chief Complaint  Patient presents with  . Leg Pain    Jonathan Gordon is a 86 y.o. male.  HPI     This is an 86 year old male with history of asthma, atrial fibrillation on Eliquis, hypertension who presents with leg pain.  Patient reports 2 to 3-week history of worsening right medial thigh pain.  He states that he has significant pain when lifting his leg.  He states he is able to move it laterally but has significant pain when raising it and is using his hand to lift his leg into the car secondary to pain.  He denies weakness.  He reports compliance with his Eliquis and no history of blood clots.  He denies any significant injury.  He has used multiple pain medications at home with no relief.  He is also used ice and a lidocaine patch with no improvement.  Currently his pain is 2 out of 10.  Denies numbness or tingling in the foot.  Past Medical History:  Diagnosis Date  . Actinic keratoses   . Arthritis   . Asthma   . Atrial fibrillation, chronic ([place]) 06/11/2017   Eliquis  . Back pain   . BPH (benign prostatic hyperplasia)   . Cancer ([place])    skin  . GERD (gastroesophageal reflux disease)   . Hypertension   . Neck pain   . Neuropathy   . Pseudophakia   . Pulmonary nodule   . Renal disorder    kidney stone    Patient Active Problem List   Diagnosis Date Noted  . Hyperglycemia 12/28/2018  . Lactic acidosis 12/28/2018  . Hypotension 12/27/2018  . Left lower quadrant pain 06/11/2017  . Low back pain 06/11/2017  . Atrial fibrillation, chronic ([place]) 06/11/2017  . OSA (obstructive sleep apnea) 12/26/2016  . Upper airway cough syndrome 11/28/2016  . Dyspnea on exertion 08/26/2016  . Bronchiectasis without complication ([place]) 70/35/0093    Past Surgical History:  Procedure Laterality Date  . BAND HEMORRHOIDECTOMY    . CARDIAC CATHETERIZATION      . HEMORRHOID SURGERY         No family history on file.  Social History   Tobacco Use  . Smoking status: Never Smoker  . Smokeless tobacco: Never Used  Vaping Use  . Vaping Use: Never used  Substance Use Topics  . Alcohol use: No  . Drug use: No    Home Medications Prior to Admission medications   Medication Sig Start Date End Date Taking? Authorizing Provider  Celecoxib (CELEBREX PO) Take by mouth.   Yes [provider]  apixaban (ELIQUIS) 5 MG TABS tablet Take 5 mg by mouth 2 (two) times daily.    [provider]  CRANBERRY JUICE EXTRACT PO Take 1 Bottle by mouth daily.    [provider]  diltiazem (CARDIZEM CD) 180 MG 24 hr capsule Take 180 mg by mouth daily. 10/28/18   [provider]  finasteride (PROSCAR) 5 MG tablet Take 5 mg by mouth daily.  06/01/18   [provider]  gabapentin (NEURONTIN) 300 MG capsule Take 1,200 mg by mouth at bedtime.  02/23/17   [provider]  metoprolol succinate (TOPROL-XL) 25 MG 24 hr tablet Take 0.5 tablets (12.5 mg total) by mouth daily. 12/29/18   Terrilee Croak, MD  OVER THE COUNTER MEDICATION Take 1 Bar by mouth daily.  Protein bar everyday    [provider]  oxybutynin (DITROPAN) 5 MG tablet Take 5 mg by mouth daily. 12/14/18   [provider]  polyvinyl alcohol (LIQUIFILM TEARS) 1.4 % ophthalmic solution Place 1 drop into both eyes as needed for dry eyes.    [provider]  temazepam (RESTORIL) 15 MG capsule Take 15 mg by mouth at bedtime.  12/27/18   [provider]  traMADol (ULTRAM) 50 MG tablet Take 1 tablet (50 mg total) by mouth every 8 (eight) hours as needed. 09/06/19   Lithzy Bernard, Barbette Hair, MD    Allergies    Hydromorphone, Coumadin [warfarin sodium], Hydrocodone-acetaminophen, Oxycontin [oxycodone hcl], and Xarelto [rivaroxaban]  Review of Systems   Review of Systems  Constitutional: Negative for fever.  Cardiovascular: Negative for leg  swelling.  Musculoskeletal:       Right leg pain  All other systems reviewed and are negative.   Physical Exam Updated Vital Signs BP 124/74 (BP Location: Right Arm)   Pulse (!) 53   Temp 98 F (36.7 C) (Oral)   Resp 19   Ht 1.727 m (5\' 8" )   Wt 81.9 kg   SpO2 100%   BMI 27.45 kg/m   Physical Exam Vitals and nursing note reviewed.  Constitutional:      Appearance: He is well-developed. He is not ill-appearing.  HENT:     Head: Normocephalic and atraumatic.     Nose: Nose normal.     Mouth/Throat:     Mouth: Mucous membranes are moist.  Eyes:     Pupils: Pupils are equal, round, and reactive to light.  Cardiovascular:     Rate and Rhythm: Normal rate and regular rhythm.     Heart sounds: Normal heart sounds. No murmur heard.   Pulmonary:     Effort: Pulmonary effort is normal. No respiratory distress.     Breath sounds: Normal breath sounds. No wheezing.  Abdominal:     Palpations: Abdomen is soft.     Tenderness: There is no abdominal tenderness.  Musculoskeletal:        General: No swelling or deformity.     Cervical back: Neck supple.     Comments: Tenderness to palpation over the proximal medial aspect of the right thigh, no overlying skin changes, no obvious deformities, normal range of motion of the knee and ankle, no asymmetric edema noted  Lymphadenopathy:     Cervical: No cervical adenopathy.  Skin:    General: Skin is warm and dry.  Neurological:     Mental Status: He is alert and oriented to person, place, and time.     Comments: 5 out of 5 strength bilateral lower extremities  Psychiatric:        Mood and Affect: Mood normal.     ED Results / Procedures / Treatments   Labs (all labs ordered are listed, but only abnormal results are displayed) Labs Reviewed - No data to display  EKG None  Radiology DG Femur Min 2 Views Right  Result Date: 09/06/2019 CLINICAL DATA:  Leg pain EXAM: RIGHT FEMUR 2 VIEWS COMPARISON:  None. FINDINGS: There is no  evidence of fracture or other focal bone lesions. Moderate left hip osteoarthritis is seen with superior joint space loss. Soft tissues are unremarkable. IMPRESSION: Negative. Electronically Signed   By: Prudencio Pair M.D.   On: 09/06/2019 00:40    Procedures Procedures (including critical care time)  Medications Ordered in ED Medications  traMADol (ULTRAM) tablet 50 mg (  has no administration in time range)    ED Course  I have reviewed the triage vital signs and the nursing notes.  Pertinent labs & imaging results that were available during my care of the patient were reviewed by me and considered in my medical decision making (see chart for details).    MDM Rules/Calculators/A&P                           Patient presents with right medial thigh pain.  He is overall nontoxic and vital signs are reassuring.  Patient denies any injury but pain is worsened with certain movements and ambulation.  History and physical exam are certainly suggestive of musculoskeletal etiology or strain.  He is on Eliquis and reports compliance with his medications.  He has no signs of swelling and I feel DVT is less likely.  Patient was given a dose of tramadol.  X-rays were obtained given his age to rule out nontraumatic fracture.  X-rays are negative and were independently reviewed by myself.  Suspect adductor strain.  Will provide Ace wrap, encouraged ice and will provide with a short course of tramadol for pain control.  Also recommend following up with PCP for physical therapy referral.  After history, exam, and medical workup I feel the patient has been appropriately medically screened and is safe for discharge home. Pertinent diagnoses were discussed with the patient. Patient was given return precautions.   Final Clinical Impression(s) / ED Diagnoses Final diagnoses:  Muscle strain    Rx / DC Orders ED Discharge Orders         Ordered    traMADol (ULTRAM) 50 MG tablet  Every 8 hours PRN      Discontinue  Reprint     09/06/19 0107           Merryl Hacker, MD 09/06/19 [phone]

## 2019-09-06 NOTE — ED Notes (Signed)
Patient transported to X-ray 

## 2019-09-06 NOTE — Discharge Instructions (Addendum)
You were seen today for leg pain.  Your x-rays do not show any signs of fracture.  You may have a muscle strain.  Keep iced.  Take medications as prescribed.  Follow-up with your primary physician for physical therapy options.  You will be given a short course of tramadol.  Do not drive while taking tramadol.

## 2020-06-16 ENCOUNTER — Other Ambulatory Visit: Payer: Self-pay

## 2020-06-16 ENCOUNTER — Emergency Department (HOSPITAL_BASED_OUTPATIENT_CLINIC_OR_DEPARTMENT_OTHER)
Admission: EM | Admit: 2020-06-16 | Discharge: 2020-06-16 | Disposition: A | Discharge status: Home / Self Care | Payer: Medicare PPO | Attending: Emergency Medicine | Admitting: Emergency Medicine

## 2020-06-16 ENCOUNTER — Emergency Department (HOSPITAL_BASED_OUTPATIENT_CLINIC_OR_DEPARTMENT_OTHER): Payer: Medicare PPO

## 2020-06-16 DIAGNOSIS — R42 Dizziness and giddiness: Secondary | ICD-10-CM | POA: Diagnosis present

## 2020-06-16 DIAGNOSIS — E860 Dehydration: Secondary | ICD-10-CM | POA: Insufficient documentation

## 2020-06-16 DIAGNOSIS — I951 Orthostatic hypotension: Secondary | ICD-10-CM | POA: Diagnosis not present

## 2020-06-16 DIAGNOSIS — Z7901 Long term (current) use of anticoagulants: Secondary | ICD-10-CM | POA: Insufficient documentation

## 2020-06-16 DIAGNOSIS — I10 Essential (primary) hypertension: Secondary | ICD-10-CM | POA: Insufficient documentation

## 2020-06-16 DIAGNOSIS — Z79899 Other long term (current) drug therapy: Secondary | ICD-10-CM | POA: Insufficient documentation

## 2020-06-16 DIAGNOSIS — Z85828 Personal history of other malignant neoplasm of skin: Secondary | ICD-10-CM | POA: Diagnosis not present

## 2020-06-16 DIAGNOSIS — J45909 Unspecified asthma, uncomplicated: Secondary | ICD-10-CM | POA: Insufficient documentation

## 2020-06-16 LAB — URINALYSIS, ROUTINE W REFLEX MICROSCOPIC
Bilirubin Urine: NEGATIVE
Glucose, UA: NEGATIVE mg/dL
Hgb urine dipstick: NEGATIVE
Ketones, ur: NEGATIVE mg/dL
Leukocytes,Ua: NEGATIVE
Nitrite: NEGATIVE
Protein, ur: NEGATIVE mg/dL
Specific Gravity, Urine: >1.03 — ABNORMAL HIGH (ref 1.005–1.030)
pH: 6 (ref 5.0–8.0)

## 2020-06-16 LAB — CBC WITH DIFFERENTIAL/PLATELET
Abs Immature Granulocytes: 0.02 10*3/uL (ref 0.00–0.07)
Basophils Absolute: 0.1 10*3/uL (ref 0.0–0.1)
Basophils Relative: 1 %
Eosinophils Absolute: 0.1 10*3/uL (ref 0.0–0.5)
Eosinophils Relative: 3 %
HCT: 45 % (ref 39.0–52.0)
Hemoglobin: 14.6 g/dL (ref 13.0–17.0)
Immature Granulocytes: 0 %
Lymphocytes Relative: 20 %
Lymphs Abs: 1 10*3/uL (ref 0.7–4.0)
MCH: 30.2 pg (ref 26.0–34.0)
MCHC: 32.4 g/dL (ref 30.0–36.0)
MCV: 93 fL (ref 80.0–100.0)
Monocytes Absolute: 0.5 10*3/uL (ref 0.1–1.0)
Monocytes Relative: 10 %
Neutro Abs: 3.2 10*3/uL (ref 1.7–7.7)
Neutrophils Relative %: 66 %
Platelets: 187 10*3/uL (ref 150–400)
RBC: 4.84 MIL/uL (ref 4.22–5.81)
RDW: 13.2 % (ref 11.5–15.5)
WBC: 4.9 10*3/uL (ref 4.0–10.5)
nRBC: 0 % (ref 0.0–0.2)

## 2020-06-16 LAB — COMPREHENSIVE METABOLIC PANEL
ALT: 12 U/L (ref 0–44)
AST: 14 U/L — ABNORMAL LOW (ref 15–41)
Albumin: 3.6 g/dL (ref 3.5–5.0)
Alkaline Phosphatase: 70 U/L (ref 38–126)
Anion gap: 7 (ref 5–15)
BUN: 22 mg/dL (ref 8–23)
CO2: 25 mmol/L (ref 22–32)
Calcium: 9.1 mg/dL (ref 8.9–10.3)
Chloride: 106 mmol/L (ref 98–111)
Creatinine, Ser: 0.83 mg/dL (ref 0.61–1.24)
GFR, Estimated: >60 mL/min (ref >60)
Glucose, Bld: 119 mg/dL — ABNORMAL HIGH (ref 70–99)
Potassium: 4 mmol/L (ref 3.5–5.1)
Sodium: 138 mmol/L (ref 135–145)
Total Bilirubin: 0.5 mg/dL (ref 0.3–1.2)
Total Protein: 5.7 g/dL — ABNORMAL LOW (ref 6.5–8.1)

## 2020-06-16 LAB — LACTIC ACID, PLASMA
Lactic Acid, Venous: 1.4 mmol/L (ref 0.5–1.9)
Lactic Acid, Venous: 1.3 mmol/L (ref 0.5–1.9)

## 2020-06-16 LAB — LIPASE, BLOOD: Lipase: 25 U/L (ref 11–51)

## 2020-06-16 MED ORDER — LACTATED RINGERS IV BOLUS
1000.0000 mL | Freq: Once | INTRAVENOUS | Status: AC
  Administered 2020-06-16: 1000 mL via INTRAVENOUS

## 2020-06-16 NOTE — ED Notes (Signed)
Pt transported to MRI 

## 2020-06-16 NOTE — ED Notes (Signed)
Pt has been advised that urine is needed

## 2020-06-16 NOTE — ED Provider Notes (Signed)
I provided a substantive portion of the care of this patient.  I personally performed the entirety of the history, exam and medical decision making for this encounter.  87 year old male here with persistent dizziness.  He describes it somewhat as lightheadedness but also some positional change can cause it as well.  Decreased appetite recently secondary to stress and what sounds like depression over grief.  Patient states his urine is been pretty dark yellow for a while he recently started antibiotics for a questionable urinary tract infection however the culture came back negative.  That Bactrim did help his symptoms however.  States he only drinks other cranberry juice dye Dr Malachi Bonds little water each day. On exam patient appears to be neurologically intact but is in atrial fibrillation.  He is on Eliquis and diltiazem and his rate controlled at this time.  Has other vital signs within normal limits.  I did not try to walk him because he is hooked up to bolus and monitoring at this time.  Will need to walk before he goes home.  With his age and the prolonged period of symptoms probably need to rule out stroke.  CT scan was okay however will get MRI to evaluate further.  I do suspect this is likely hypovolemia secondary to his other symptoms so we will continue hydration and will ambulate after those are done.  EKG Interpretation  Date/Time:  Saturday Jun 16 2020 14:36:50 EDT Ventricular Rate:  94 PR Interval:    QRS Duration: 92 QT Interval:  349 QTC Calculation: 437 R Axis:   77 Text Interpretation: Atrial fibrillation Low voltage, precordial leads Borderline ST depression, diffuse leads Compared to previous tracing 12/20 Confirmed by Aletta Edouard [phone]) on 06/16/2020 2:52:13 PM     Hetty Linhart, Corene Cornea, MD 06/16/20 2101

## 2020-06-16 NOTE — Discharge Instructions (Signed)
You were seen in the ER today for your lightheadedness and unsteadiness with walking.  Your physical exam, blood work, urine test, CT scan and MRI were very reassuring.   You felt much better and were able to walk without difficulty after administration of IV fluids. The dizziness you have been experiencing is likely due to dehydration. Please increase your hydration at home, and take care to limit your caffeine intake.   Return to the ER if you develop any new dizziness or lightheadedness, numbness, ting, weakness in your arms or legs, shortness of breath, or any other new severe symptoms.

## 2020-06-16 NOTE — ED Provider Notes (Signed)
[facility] EMERGENCY DEPARTMENT Provider Note   CSN: [id number] Arrival date & time: 06/16/20  1412     History Chief Complaint  Patient presents with  . Dizziness    Jonathan Gordon is a 87 y.o. male who is currently being treated for a urinary tract infection on Bactrim x5 days who presents with concern for severe lightheadedness x3 days when changing positions.  Most notably when he stands.  He states that he feels like he "is drunk but without any alcohol".  Denies sensation that the room is spinning. Of note the lightheadedness does not improve after he has been standing for a few minutes, he feels that he is staggering when he walks.  He denies any recent falls, head trauma, but he is anticoagulated with Eliquis for atrial fibrillation.  He denies any blurry vision or double vision, denies any confusion. Of note patient states that his wife died 19 months ago and since that time his diet has changed significantly.  He states that he eats very little, and drinks even less.  He states he has had 1 glass of cranberry juice to drink today, and similar amount of liquids to drink yesterday, states he does not drink any water. Additionally states he has gradually lost 30 pounds over the last 11 months since his wife died.  Used to weigh 190s, now endorses weighing 160s, weight loss has been unintentional.  States that he presented to urgent care on Monday in [place] with concern for 10 days of dysuria, urinary frequency and urgency, as well as multiple episodes of urinary incontinence.  At that time he was diagnosed with a urinary tract infection and started on Bactrim.  He denies any fevers or chills, abdominal pain, nausea, vomiting, diarrhea.  States that his urinary symptoms have resolved since starting Bactrim.  Personally reviewed the patient's medical record.  He has history of atrial fibrillation, asthma, BPH, and hypertension.  Additionally has multiple scabs on face secondary to  recent treatment for multiple skin cancers lesions by dermatologist.  To undergo Mohs surgery for basal cell carcinoma to the top of the head.  HPI     Past Medical History:  Diagnosis Date  . Actinic keratoses   . Arthritis   . Asthma   . Atrial fibrillation, chronic ([place]) 06/11/2017   Eliquis  . Back pain   . BPH (benign prostatic hyperplasia)   . Cancer ([place])    skin  . GERD (gastroesophageal reflux disease)   . Hypertension   . Neck pain   . Neuropathy   . Pseudophakia   . Pulmonary nodule   . Renal disorder    kidney stone    Patient Active Problem List   Diagnosis Date Noted  . Hyperglycemia 12/28/2018  . Lactic acidosis 12/28/2018  . Hypotension 12/27/2018  . Left lower quadrant pain 06/11/2017  . Low back pain 06/11/2017  . Atrial fibrillation, chronic ([place]) 06/11/2017  . OSA (obstructive sleep apnea) 12/26/2016  . Upper airway cough syndrome 11/28/2016  . Dyspnea on exertion 08/26/2016  . Bronchiectasis without complication ([place]) [id number]    Past Surgical History:  Procedure Laterality Date  . BAND HEMORRHOIDECTOMY    . CARDIAC CATHETERIZATION    . HEMORRHOID SURGERY         No family history on file.  Social History   Tobacco Use  . Smoking status: Never Smoker  . Smokeless tobacco: Never Used  Vaping Use  . Vaping Use: Never used  Substance Use Topics  .  Alcohol use: No  . Drug use: No    Home Medications Prior to Admission medications   Medication Sig Start Date End Date Taking? Authorizing Provider  apixaban (ELIQUIS) 5 MG TABS tablet Take 5 mg by mouth 2 (two) times daily.    [provider]  Celecoxib (CELEBREX PO) Take by mouth.    [provider]  CRANBERRY JUICE EXTRACT PO Take 1 Bottle by mouth daily.    [provider]  diltiazem (CARDIZEM CD) 180 MG 24 hr capsule Take 180 mg by mouth daily. 10/28/18   [provider]  finasteride (PROSCAR) 5 MG tablet Take 5 mg by mouth daily.  06/01/18    [provider]  gabapentin (NEURONTIN) 300 MG capsule Take 1,200 mg by mouth at bedtime.  02/23/17   [provider]  metoprolol succinate (TOPROL-XL) 25 MG 24 hr tablet Take 0.5 tablets (12.5 mg total) by mouth daily. 12/29/18   Terrilee Croak, MD  OVER THE COUNTER MEDICATION Take 1 Bar by mouth daily. Protein bar everyday    [provider]  oxybutynin (DITROPAN) 5 MG tablet Take 5 mg by mouth daily. 12/14/18   [provider]  polyvinyl alcohol (LIQUIFILM TEARS) 1.4 % ophthalmic solution Place 1 drop into both eyes as needed for dry eyes.    [provider]  temazepam (RESTORIL) 15 MG capsule Take 15 mg by mouth at bedtime.  12/27/18   [provider]  traMADol (ULTRAM) 50 MG tablet Take 1 tablet (50 mg total) by mouth every 8 (eight) hours as needed. 09/06/19   Horton, Barbette Hair, MD    Allergies    Hydromorphone, Coumadin [warfarin sodium], Hydrocodone-acetaminophen, Oxycontin [oxycodone hcl], and Xarelto [rivaroxaban]  Review of Systems   Review of Systems  Constitutional: Negative for activity change, appetite change, chills, diaphoresis, fatigue, fever and unexpected weight change.  HENT: Negative.   Respiratory: Negative.   Cardiovascular: Negative.  Negative for palpitations.  Gastrointestinal: Negative.   Genitourinary: Positive for decreased urine volume, dysuria, frequency and urgency. Negative for difficulty urinating, flank pain, hematuria, penile discharge, penile pain, penile swelling, scrotal swelling and testicular pain.  Musculoskeletal: Negative.   Skin: Negative.   Neurological: Positive for light-headedness. Negative for dizziness, tremors, syncope, speech difficulty, weakness, numbness and headaches.  Hematological: Bruises/bleeds easily.       Eliquis    Physical Exam Updated Vital Signs BP 125/75 (BP Location: Left Arm)   Pulse 93   Temp 97.8 F (36.6 C) (Oral)   Resp 16   Ht 5\' 8"  (1.727 m)   Wt 74.4 kg    SpO2 95%   BMI 24.94 kg/m   Physical Exam Vitals and nursing note reviewed.  Constitutional:      Appearance: He is normal weight. He is not ill-appearing or toxic-appearing.  HENT:     Head: Normocephalic and atraumatic.     Nose: Nose normal.     Mouth/Throat:     Mouth: Mucous membranes are moist.     Pharynx: Oropharynx is clear. Uvula midline. No oropharyngeal exudate, posterior oropharyngeal erythema or uvula swelling.     Tonsils: No tonsillar exudate.  Eyes:     General: Lids are normal. Vision grossly intact.        Right eye: No discharge.        Left eye: No discharge.     Extraocular Movements: Extraocular movements intact.     Conjunctiva/sclera: Conjunctivae normal.     Pupils: Pupils are equal, round, and  reactive to light.  Neck:     Vascular: No JVD.     Trachea: Trachea and phonation normal.  Cardiovascular:     Rate and Rhythm: Normal rate. Rhythm irregularly irregular.     Pulses: Normal pulses.     Heart sounds: Normal heart sounds. No murmur heard.   Pulmonary:     Effort: Pulmonary effort is normal. No tachypnea, bradypnea, accessory muscle usage or respiratory distress.     Breath sounds: Normal breath sounds. No wheezing or rales.  Chest:     Chest wall: No mass, lacerations, deformity, swelling, tenderness, crepitus or edema.  Abdominal:     General: Bowel sounds are normal. There is no distension.     Palpations: Abdomen is soft.     Tenderness: There is no abdominal tenderness.  Musculoskeletal:        General: No deformity.     Cervical back: Normal range of motion and neck supple. No edema, rigidity or crepitus. No pain with movement, spinous process tenderness or muscular tenderness.     Right lower leg: No edema.     Left lower leg: No edema.  Lymphadenopathy:     Cervical: No cervical adenopathy.  Skin:    General: Skin is warm and dry.     Capillary Refill: Capillary refill takes less than 2 seconds.       Neurological:      Mental Status: He is alert. Mental status is at baseline.     Cranial Nerves: Cranial nerves are intact.     Sensory: Sensation is intact.     Motor: Motor function is intact.  Psychiatric:        Mood and Affect: Mood normal.     ED Results / Procedures / Treatments   Labs (all labs ordered are listed, but only abnormal results are displayed) Labs Reviewed  COMPREHENSIVE METABOLIC PANEL - Abnormal; Notable for the following components:      Result Value   Glucose, Bld 119 (*)    Total Protein 5.7 (*)    AST 14 (*)    All other components within normal limits  URINALYSIS, ROUTINE W REFLEX MICROSCOPIC - Abnormal; Notable for the following components:   Specific Gravity, Urine >1.030 (*)    All other components within normal limits  URINE CULTURE  CBC WITH DIFFERENTIAL/PLATELET  LIPASE, BLOOD  LACTIC ACID, PLASMA  LACTIC ACID, PLASMA    EKG EKG Interpretation  Date/Time:  Saturday Jun 16 2020 14:36:50 EDT Ventricular Rate:  94 PR Interval:    QRS Duration: 92 QT Interval:  349 QTC Calculation: 437 R Axis:   77 Text Interpretation: Atrial fibrillation Low voltage, precordial leads Borderline ST depression, diffuse leads Compared to previous tracing 12/20 Confirmed by Aletta Edouard [phone]) on 06/16/2020 2:52:13 PM   Radiology CT Head Wo Contrast  Result Date: 06/16/2020 CLINICAL DATA:  Dizziness and headache. EXAM: CT HEAD WITHOUT CONTRAST TECHNIQUE: Contiguous axial images were obtained from the base of the skull through the vertex without intravenous contrast. COMPARISON:  None. FINDINGS: Brain: There is mild cerebral atrophy with widening of the extra-axial spaces and ventricular dilatation. There are areas of decreased attenuation within the white matter tracts of the supratentorial brain, consistent with microvascular disease changes. Vascular: No hyperdense vessel or unexpected calcification. Skull: Normal. Negative for fracture or focal lesion. Sinuses/Orbits: No  acute finding. Other: None. IMPRESSION: 1. Generalized cerebral atrophy. 2. No acute intracranial abnormality. Electronically Signed   By: Joyce Gross.D.  On: 06/16/2020 15:34   MR BRAIN WO CONTRAST  Result Date: 06/16/2020 CLINICAL DATA:  Dizziness EXAM: MRI HEAD WITHOUT CONTRAST TECHNIQUE: Multiplanar, multiecho pulse sequences of the brain and surrounding structures were obtained without intravenous contrast. COMPARISON:  CT head 06/16/2020 FINDINGS: Brain: Ventricle size and cerebral volume normal for age. Negative for acute infarct. Mild white matter changes with scattered small white matter hyperintensities bilaterally. Negative for hemorrhage or mass. Vascular: Normal arterial flow voids Skull and upper cervical spine: No focal skeletal lesion. Sinuses/Orbits: Negative Other: None IMPRESSION: No acute abnormality. Mild chronic microvascular ischemic change in the white matter. Electronically Signed   By: Franchot Gallo M.D.   On: 06/16/2020 17:33   DG Chest Portable 1 View  Result Date: 06/16/2020 CLINICAL DATA:  Weakness. EXAM: PORTABLE CHEST 1 VIEW COMPARISON:  December 27, 2018 FINDINGS: The heart size and mediastinal contours are within normal limits. Both lungs are clear. The visualized skeletal structures are unremarkable. IMPRESSION: No active disease. Electronically Signed   By: Virgina Norfolk M.D.   On: 06/16/2020 15:33    Procedures Procedures   Medications Ordered in ED Medications  lactated ringers bolus 1,000 mL (0 mLs Intravenous Stopped 06/16/20 1645)    ED Course  I have reviewed the triage vital signs and the nursing notes.  Pertinent labs & imaging results that were available during my care of the patient were reviewed by me and considered in my medical decision making (see chart for details).    MDM Rules/Calculators/A&P                          87 year old male presents with concern for lightheadedness associated with change of position, x3 days  The  differential diagnosis of weakness includes but is not limited to: Marland Kitchen Neurologic causes: GBS, myasthenia gravis, CVA, MS, ALS, transverse myelitis, spinal cord injury, CVA, botulism . Other causes: ACS, Arrhythmia, syncope, orthostatic hypotension, sepsis, hypoglycemia, electrolyte disturbance, hypothyroidism, respiratory failure, symptomatic anemia, dehydration, heat injury, polypharmacy, malignancy.  Borderline hypotensive on intake with BP of 102/71.  Vital signs otherwise normal.  Cardiopulmonary exam is normal, abdominal exam is benign.  Skin exam with healing lesions from dermatologic procedures, as above.  Neurologic exam is without focal deficit.  Of note patient with decreased skin turgor, clinically appears mildly dehydrated.  EKG with atrial fibrillation without rapid ventricular response.  We will proceed with fluid bolus and work-up.  CBC unremarkable, CMP with mild hyperglycemia, UA unremarkable.  Lipase is normal lactic is normal CT of the head negative for acute intracranial abnormality.  Chest x-ray negative for acute cardiopulmonary disease.  Patient evaluated by attending physician, will proceed with MRI given history of breakthrough clotting despite anticoagulation.  MRI of brain negative for acute abnormality.  Patient was ambulated in the emergency department following IV fluid bolus.  Patient ambulated independently without difficulty (does not use a walking assist devices at baseline).  He endorses total resolution of his lightheadedness and unsteady sensation with ambulation.  Suspected symptoms were entirely secondary to poor hydration status given evidence of orthostasis on orthostatic vital signs and improvement in symptoms following IV fluid resuscitation.  Encouraged increased hydration and limit of caffeine intake at home.  No further work-up warranted in the ED at this time.  Arnie voiced understanding of his medical evaluation and treatment plan.  Each of his questions  was answered to his expressed satisfaction.  Strict return precautions were given.  Patient is well-appearing, stable, and  appropriate for discharge at this time.  This chart was dictated using voice recognition software, Dragon. Despite the best efforts of this provider to proofread and correct errors, errors may still occur which can change documentation meaning.  Final Clinical Impression(s) / ED Diagnoses Final diagnoses:  Dehydration  Orthostasis    Rx / DC Orders ED Discharge Orders    None       Aura Dials 06/16/20 1807    Hayden Rasmussen, MD 06/16/20 [phone]

## 2020-06-16 NOTE — ED Notes (Signed)
Patient transported to CT 

## 2020-06-16 NOTE — ED Triage Notes (Signed)
Dizziness since Thursday - feels like he his drunk Has to hold on to things to walk Not vertigo - per pt  Denies N/V/D headache Taking Bactrium for UTI - started ABX on Monday

## 2020-06-18 LAB — URINE CULTURE: Culture: <10000 — AB

## 2020-07-27 ENCOUNTER — Encounter (HOSPITAL_BASED_OUTPATIENT_CLINIC_OR_DEPARTMENT_OTHER): Payer: Self-pay | Admitting: *Deleted

## 2020-07-27 ENCOUNTER — Other Ambulatory Visit: Payer: Self-pay

## 2020-07-27 ENCOUNTER — Emergency Department (HOSPITAL_BASED_OUTPATIENT_CLINIC_OR_DEPARTMENT_OTHER)
Admission: EM | Admit: 2020-07-27 | Discharge: 2020-07-27 | Disposition: A | Discharge status: Home / Self Care | Payer: Medicare PPO | Attending: Emergency Medicine | Admitting: Emergency Medicine

## 2020-07-27 ENCOUNTER — Emergency Department (HOSPITAL_BASED_OUTPATIENT_CLINIC_OR_DEPARTMENT_OTHER): Payer: Medicare PPO

## 2020-07-27 DIAGNOSIS — R42 Dizziness and giddiness: Secondary | ICD-10-CM | POA: Diagnosis present

## 2020-07-27 DIAGNOSIS — Z85828 Personal history of other malignant neoplasm of skin: Secondary | ICD-10-CM | POA: Insufficient documentation

## 2020-07-27 DIAGNOSIS — J45909 Unspecified asthma, uncomplicated: Secondary | ICD-10-CM | POA: Diagnosis not present

## 2020-07-27 DIAGNOSIS — I4891 Unspecified atrial fibrillation: Secondary | ICD-10-CM | POA: Diagnosis not present

## 2020-07-27 DIAGNOSIS — Z79899 Other long term (current) drug therapy: Secondary | ICD-10-CM | POA: Diagnosis not present

## 2020-07-27 DIAGNOSIS — Z7901 Long term (current) use of anticoagulants: Secondary | ICD-10-CM | POA: Diagnosis not present

## 2020-07-27 DIAGNOSIS — I10 Essential (primary) hypertension: Secondary | ICD-10-CM | POA: Insufficient documentation

## 2020-07-27 LAB — CBC WITH DIFFERENTIAL/PLATELET
Abs Immature Granulocytes: 0.03 10*3/uL (ref 0.00–0.07)
Basophils Absolute: 0.1 10*3/uL (ref 0.0–0.1)
Basophils Relative: 1 %
Eosinophils Absolute: 0.2 10*3/uL (ref 0.0–0.5)
Eosinophils Relative: 3 %
HCT: 46 % (ref 39.0–52.0)
Hemoglobin: 15.5 g/dL (ref 13.0–17.0)
Immature Granulocytes: 1 %
Lymphocytes Relative: 19 %
Lymphs Abs: 1.1 10*3/uL (ref 0.7–4.0)
MCH: 30.7 pg (ref 26.0–34.0)
MCHC: 33.7 g/dL (ref 30.0–36.0)
MCV: 91.1 fL (ref 80.0–100.0)
Monocytes Absolute: 0.5 10*3/uL (ref 0.1–1.0)
Monocytes Relative: 8 %
Neutro Abs: 4.1 10*3/uL (ref 1.7–7.7)
Neutrophils Relative %: 68 %
Platelets: 190 10*3/uL (ref 150–400)
RBC: 5.05 MIL/uL (ref 4.22–5.81)
RDW: 13.6 % (ref 11.5–15.5)
WBC: 6 10*3/uL (ref 4.0–10.5)
nRBC: 0 % (ref 0.0–0.2)

## 2020-07-27 LAB — COMPREHENSIVE METABOLIC PANEL
ALT: 12 U/L (ref 0–44)
AST: 17 U/L (ref 15–41)
Albumin: 4.1 g/dL (ref 3.5–5.0)
Alkaline Phosphatase: 76 U/L (ref 38–126)
Anion gap: 6 (ref 5–15)
BUN: 17 mg/dL (ref 8–23)
CO2: 30 mmol/L (ref 22–32)
Calcium: 8.9 mg/dL (ref 8.9–10.3)
Chloride: 102 mmol/L (ref 98–111)
Creatinine, Ser: 0.84 mg/dL (ref 0.61–1.24)
GFR, Estimated: >60 mL/min (ref >60)
Glucose, Bld: 204 mg/dL — ABNORMAL HIGH (ref 70–99)
Potassium: 3.7 mmol/L (ref 3.5–5.1)
Sodium: 138 mmol/L (ref 135–145)
Total Bilirubin: 0.8 mg/dL (ref 0.3–1.2)
Total Protein: 6.3 g/dL — ABNORMAL LOW (ref 6.5–8.1)

## 2020-07-27 MED ORDER — MECLIZINE HCL 25 MG PO TABS: 25.0000 mg | ORAL_TABLET | Freq: Three times a day (TID) | ORAL | 0 refills | Status: AC | PRN

## 2020-07-27 MED ORDER — MECLIZINE HCL 25 MG PO TABS
25.0000 mg | ORAL_TABLET | Freq: Once | ORAL | Status: AC
  Administered 2020-07-27: 25 mg via ORAL
  Filled 2020-07-27: qty 1

## 2020-07-27 MED ORDER — SODIUM CHLORIDE 0.9 % IV BOLUS
1000.0000 mL | Freq: Once | INTRAVENOUS | Status: AC
  Administered 2020-07-27: 1000 mL via INTRAVENOUS

## 2020-07-27 NOTE — ED Notes (Signed)
Patient transported to CT 

## 2020-07-27 NOTE — ED Triage Notes (Addendum)
Pt c/o unsteady gait x 2 days . Diarrhea x 2 days . Hx vertigo  Seen here last month for dehydration . , seen today at [facility] done  And sent for eval. Been working outside for several days

## 2020-07-27 NOTE — Discharge Instructions (Addendum)
Return for worsening symptoms, inability to walk.  Follow up with your doctor in the office.

## 2020-07-27 NOTE — ED Provider Notes (Signed)
[facility] Provider Note   CSN: [id number] Arrival date & time: 07/27/20  2047     History Chief Complaint  Patient presents with   Gait Problem    Jonathan Gordon is a 87 y.o. male.  87 yo M with a chief complaints of dizziness.  Patient states that this started a few days ago is worse with head movement and then resolves with holding his head still.  He had some trouble when he turns his head or turns his body rapidly while walking.  Has not fallen and so he is able to catch his balance.  Denies headache denies neck pain denies head injury or neck trauma.  Denies one-sided numbness or weakness denies difficulty with speech or swallowing.  He has been having frequent bowel movements, about 4 a day for the past few weeks to a month.  Denies abdominal pain denies nausea or vomiting.  He has been doing quite a bit of work out in the yard even though its been in the 90s.  The history is provided by the patient and a relative.  Illness Severity:  Moderate Onset quality:  Sudden Duration:  3 days Timing:  Constant Progression:  Worsening Chronicity:  New Associated symptoms: no abdominal pain, no chest pain, no congestion, no diarrhea, no fever, no headaches, no myalgias, no rash, no shortness of breath and no vomiting       Past Medical History:  Diagnosis Date   Actinic keratoses    Arthritis    Asthma    Atrial fibrillation, chronic (HCC) 06/11/2017   Eliquis   Back pain    BPH (benign prostatic hyperplasia)    Cancer (HCC)    skin   GERD (gastroesophageal reflux disease)    Hypertension    Neck pain    Neuropathy    Pseudophakia    Pulmonary nodule    Renal disorder    kidney stone    Patient Active Problem List   Diagnosis Date Noted   Hyperglycemia 12/28/2018   Lactic acidosis 12/28/2018   Hypotension 12/27/2018   Left lower quadrant pain 06/11/2017   Low back pain 06/11/2017   Atrial fibrillation, chronic ([place]) 06/11/2017   OSA  (obstructive sleep apnea) 12/26/2016   Upper airway cough syndrome 11/28/2016   Dyspnea on exertion 08/26/2016   Bronchiectasis without complication ([place]) 50/09/3816    Past Surgical History:  Procedure Laterality Date   BAND HEMORRHOIDECTOMY     CARDIAC CATHETERIZATION     HEMORRHOID SURGERY         No family history on file.  Social History   Tobacco Use   Smoking status: Never   Smokeless tobacco: Never  Vaping Use   Vaping Use: Never used  Substance Use Topics   Alcohol use: No   Drug use: No    Home Medications Prior to Admission medications   Medication Sig Start Date End Date Taking? Authorizing Provider  meclizine (ANTIVERT) 25 MG tablet Take 1 tablet (25 mg total) by mouth 3 (three) times daily as needed for dizziness. 07/27/20  Yes Deno Etienne, DO  apixaban (ELIQUIS) 5 MG TABS tablet Take 5 mg by mouth 2 (two) times daily.    [provider]  Celecoxib (CELEBREX PO) Take by mouth.    [provider]  CRANBERRY JUICE EXTRACT PO Take 1 Bottle by mouth daily.    [provider]  diltiazem (CARDIZEM CD) 180 MG 24 hr capsule Take 180 mg by mouth daily. 10/28/18  [provider]  finasteride (PROSCAR) 5 MG tablet Take 5 mg by mouth daily.  06/01/18   [provider]  gabapentin (NEURONTIN) 300 MG capsule Take 1,200 mg by mouth at bedtime.  02/23/17   [provider]  metoprolol succinate (TOPROL-XL) 25 MG 24 hr tablet Take 0.5 tablets (12.5 mg total) by mouth daily. 12/29/18   Terrilee Croak, MD  OVER THE COUNTER MEDICATION Take 1 Bar by mouth daily. Protein bar everyday    [provider]  oxybutynin (DITROPAN) 5 MG tablet Take 5 mg by mouth daily. 12/14/18   [provider]  polyvinyl alcohol (LIQUIFILM TEARS) 1.4 % ophthalmic solution Place 1 drop into both eyes as needed for dry eyes.    [provider]  temazepam (RESTORIL) 15 MG capsule Take 15 mg by mouth at bedtime.  12/27/18   [provider]  traMADol (ULTRAM) 50 MG tablet Take 1 tablet (50 mg total) by mouth every 8 (eight) hours as needed. 09/06/19   Horton, Barbette Hair, MD    Allergies    Hydromorphone, Coumadin [warfarin sodium], Hydrocodone-acetaminophen, Oxycontin [oxycodone hcl], and Xarelto [rivaroxaban]  Review of Systems   Review of Systems  Constitutional:  Negative for chills and fever.  HENT:  Negative for congestion and facial swelling.   Eyes:  Negative for discharge and visual disturbance.  Respiratory:  Negative for shortness of breath.   Cardiovascular:  Negative for chest pain and palpitations.  Gastrointestinal:  Negative for abdominal pain, diarrhea and vomiting.  Musculoskeletal:  Negative for arthralgias and myalgias.  Skin:  Negative for color change and rash.  Neurological:  Positive for dizziness. Negative for tremors, syncope and headaches.  Psychiatric/Behavioral:  Negative for confusion and dysphoric mood.    Physical Exam Updated Vital Signs BP (!) 111/59   Pulse 86   Temp 97.9 F (36.6 C) (Oral)   Resp 18   Ht 5\' 8"  (1.727 m)   Wt 76.2 kg   SpO2 99%   BMI 25.54 kg/m   Physical Exam Vitals and nursing note reviewed.  Constitutional:      Appearance: He is well-developed.  HENT:     Head: Normocephalic and atraumatic.  Eyes:     Pupils: Pupils are equal, round, and reactive to light.  Neck:     Vascular: No JVD.  Cardiovascular:     Rate and Rhythm: Normal rate and regular rhythm.     Heart sounds: No murmur heard.   No friction rub. No gallop.  Pulmonary:     Effort: No respiratory distress.     Breath sounds: No wheezing.  Abdominal:     General: There is no distension.     Tenderness: There is no abdominal tenderness. There is no guarding or rebound.  Musculoskeletal:        General: Normal range of motion.     Cervical back: Normal range of motion and neck supple.  Skin:    Coloration: Skin is not pale.     Findings: No rash.  Neurological:      Mental Status: He is alert and oriented to person, place, and time.     Comments: Right-sided fast-growing nystagmus.  Somewhat unsteady gait when the patient turns rapidly.  Able to ambulate independently.  Psychiatric:        Behavior: Behavior normal.  s  ED Results / Procedures / Treatments   Labs (all labs ordered are listed, but only abnormal results are displayed) Labs Reviewed  COMPREHENSIVE METABOLIC  PANEL - Abnormal; Notable for the following components:      Result Value   Glucose, Bld 204 (*)    Total Protein 6.3 (*)    All other components within normal limits  CBC WITH DIFFERENTIAL/PLATELET    EKG None  Radiology CT Head Wo Contrast  Result Date: 07/27/2020 CLINICAL DATA:  Dizziness and vertigo. EXAM: CT HEAD WITHOUT CONTRAST TECHNIQUE: Contiguous axial images were obtained from the base of the skull through the vertex without intravenous contrast. COMPARISON:  Jun 16, 2020 FINDINGS: Brain: There is mild cerebral atrophy with widening of the extra-axial spaces and ventricular dilatation. There are areas of decreased attenuation within the white matter tracts of the supratentorial brain, consistent with microvascular disease changes. Vascular: No hyperdense vessel or unexpected calcification. Skull: Normal. Negative for fracture or focal lesion. Sinuses/Orbits: No acute finding. Other: None. IMPRESSION: 1. Generalized cerebral atrophy. 2. No acute intracranial abnormality. Electronically Signed   By: Virgina Norfolk M.D.   On: 07/27/2020 21:30    Procedures Procedures   Medications Ordered in ED Medications  meclizine (ANTIVERT) tablet 25 mg (has no administration in time range)  sodium chloride 0.9 % bolus 1,000 mL ( Intravenous Stopped 07/27/20 2335)    ED Course  I have reviewed the triage vital signs and the nursing notes.  Pertinent labs & imaging results that were available during my care of the patient were reviewed by me and considered in my medical decision  making (see chart for details).    MDM Rules/Calculators/A&P                          87 yo M with a chief complaints of episodic dizziness.  Worse with head movements and twisting of his body.  Has been going on for couple days.  Started abruptly when he woke up and turned his head suddenly.  Sounds like BPPV unfortunately unable to reproduce his symptoms with the Dix-Hallpike maneuver done on both sides.  I did offer MRI imaging which she is declining.  The other possibility is that the patient has some component of dehydration playing as the patient has been doing a lot of work outside and has not really been eating and drinking that well.  He was given IV fluids with some symptomatic improvement.  He is electing to go home at this time.  We will follow-up as an outpatient has an appointment scheduled with his physician on Tuesday.  He agrees to return anytime things worsen.  11:40 PM:  I have discussed the diagnosis/risks/treatment options with the patient and believe the pt to be eligible for discharge home to follow-up with PCP. We also discussed returning to the ED immediately if new or worsening sx occur. We discussed the sx which are most concerning (e.g., sudden worsening pain, fever, inability to tolerate by mouth) that necessitate immediate return. Medications administered to the patient during their visit and any new prescriptions provided to the patient are listed below.  Medications given during this visit Medications  meclizine (ANTIVERT) tablet 25 mg (has no administration in time range)  sodium chloride 0.9 % bolus 1,000 mL ( Intravenous Stopped 07/27/20 2335)     The patient appears reasonably screen and/or stabilized for discharge and I doubt any other medical condition or other [facility] requiring further screening, evaluation, or treatment in the ED at this time prior to discharge.   Final Clinical Impression(s) / ED Diagnoses Final diagnoses:  Dizziness  Rx / DC Orders ED  Discharge Orders          Ordered    meclizine (ANTIVERT) 25 MG tablet  3 times daily PRN        07/27/20 2334             Deno Etienne, DO 07/27/20 2341

## 2021-01-26 ENCOUNTER — Emergency Department (HOSPITAL_BASED_OUTPATIENT_CLINIC_OR_DEPARTMENT_OTHER): Payer: Medicare PPO

## 2021-01-26 ENCOUNTER — Other Ambulatory Visit: Payer: Self-pay

## 2021-01-26 ENCOUNTER — Emergency Department (HOSPITAL_BASED_OUTPATIENT_CLINIC_OR_DEPARTMENT_OTHER)
Admission: EM | Admit: 2021-01-26 | Discharge: 2021-01-26 | Discharge status: Against Medical Advice | Payer: Medicare PPO | Attending: Emergency Medicine | Admitting: Emergency Medicine

## 2021-01-26 ENCOUNTER — Encounter (HOSPITAL_BASED_OUTPATIENT_CLINIC_OR_DEPARTMENT_OTHER): Payer: Self-pay | Admitting: Emergency Medicine

## 2021-01-26 DIAGNOSIS — Z79899 Other long term (current) drug therapy: Secondary | ICD-10-CM | POA: Diagnosis not present

## 2021-01-26 DIAGNOSIS — Z85820 Personal history of malignant melanoma of skin: Secondary | ICD-10-CM | POA: Diagnosis not present

## 2021-01-26 DIAGNOSIS — Y92410 Unspecified street and highway as the place of occurrence of the external cause: Secondary | ICD-10-CM | POA: Diagnosis not present

## 2021-01-26 DIAGNOSIS — S60221A Contusion of right hand, initial encounter: Secondary | ICD-10-CM | POA: Insufficient documentation

## 2021-01-26 DIAGNOSIS — I10 Essential (primary) hypertension: Secondary | ICD-10-CM | POA: Insufficient documentation

## 2021-01-26 DIAGNOSIS — Z7901 Long term (current) use of anticoagulants: Secondary | ICD-10-CM | POA: Insufficient documentation

## 2021-01-26 DIAGNOSIS — J45909 Unspecified asthma, uncomplicated: Secondary | ICD-10-CM | POA: Insufficient documentation

## 2021-01-26 DIAGNOSIS — I4891 Unspecified atrial fibrillation: Secondary | ICD-10-CM | POA: Diagnosis not present

## 2021-01-26 DIAGNOSIS — Z23 Encounter for immunization: Secondary | ICD-10-CM | POA: Diagnosis not present

## 2021-01-26 DIAGNOSIS — R079 Chest pain, unspecified: Secondary | ICD-10-CM | POA: Diagnosis not present

## 2021-01-26 DIAGNOSIS — S6991XA Unspecified injury of right wrist, hand and finger(s), initial encounter: Secondary | ICD-10-CM | POA: Diagnosis present

## 2021-01-26 MED ORDER — TETANUS-DIPHTH-ACELL PERTUSSIS 5-2.5-18.5 LF-MCG/0.5 IM SUSY
0.5000 mL | PREFILLED_SYRINGE | Freq: Once | INTRAMUSCULAR | Status: AC
  Administered 2021-01-26: 0.5 mL via INTRAMUSCULAR
  Filled 2021-01-26: qty 0.5
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
## 2021-01-26 NOTE — ED Notes (Signed)
Pt reports that he doesn'twant to wait any longer. Dr. Doren Custard notified. Asked this RN to place splint on pt's finger. Splint placed. Pt left. Stable ambulatory
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
## 2021-01-29 ENCOUNTER — Ambulatory Visit (HOSPITAL_COMMUNITY)
Admission: RE | Admit: 2021-01-29 | Discharge: 2021-01-29 | Disposition: A | Discharge status: Home / Self Care | Payer: PRIVATE HEALTH INSURANCE | Source: Ambulatory Visit | Attending: Student | Admitting: Student

## 2021-01-29 ENCOUNTER — Encounter (HOSPITAL_COMMUNITY): Payer: Self-pay

## 2021-01-29 ENCOUNTER — Other Ambulatory Visit: Payer: Self-pay

## 2021-01-29 VITALS — BP 140/73 | HR 94 | Temp 98.0°F | Resp 20

## 2021-01-29 DIAGNOSIS — S62662A Nondisplaced fracture of distal phalanx of right middle finger, initial encounter for closed fracture: Secondary | ICD-10-CM

## 2021-01-29 NOTE — Discharge Instructions (Addendum)
-  Keep your finger splint on until you follow-up with orthopedist. Call them tomorrow to set this up.  -Rest, ice, tylenol
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
## 2021-01-29 NOTE — ED Triage Notes (Signed)
Pt was seen at [facility] on 01-26-21

## 2021-01-29 NOTE — ED Triage Notes (Signed)
Pt has a splint on Rt middle and Rt ring finger

## 2023-03-28 IMAGING — CT CT HEAD W/O CM
3 series · 14 of 47 positions shown, 16 images · non-contrast
Comparison: None.

CLINICAL DATA: Dizziness and headache.

EXAM:
CT HEAD WITHOUT CONTRAST
TECHNIQUE: Contiguous axial images were obtained from the base of the skull
through the vertex without intravenous contrast.

[Series 3: ax head wo · axial · 0.38mm/px · z∈[+542,+666]mm · 8 of 30 slices shown, 10 images]
[im 3/30  brain]
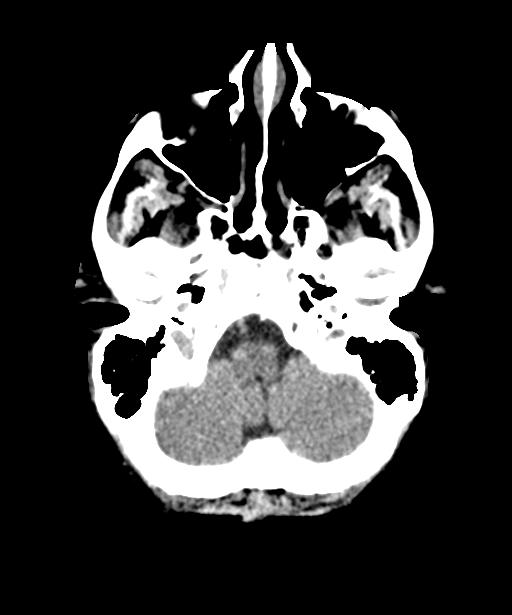
[im 3/30  bone]
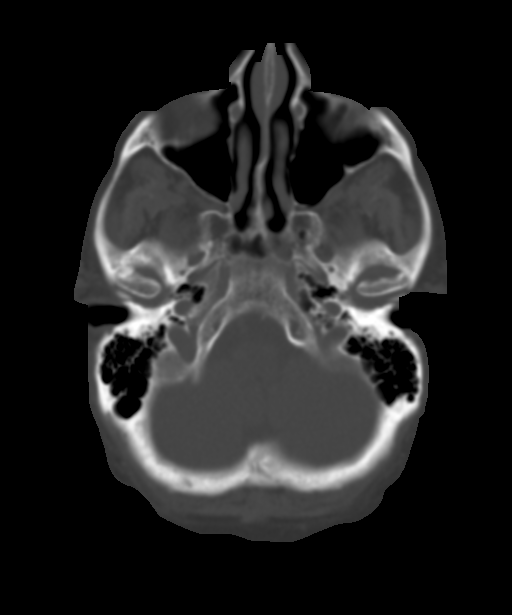
[im 7/30  brain]
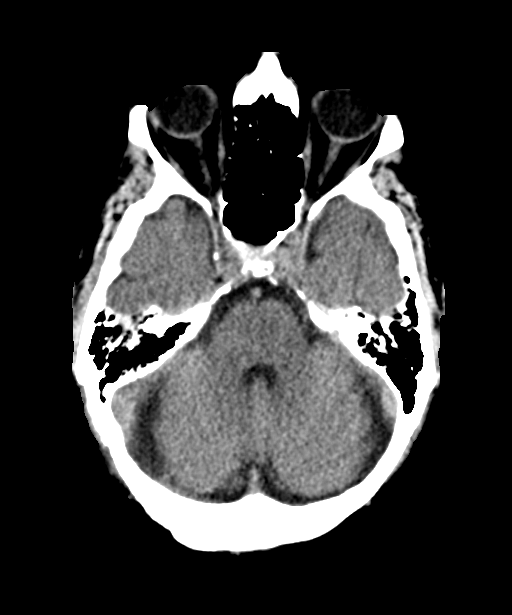
[im 10/30  brain]
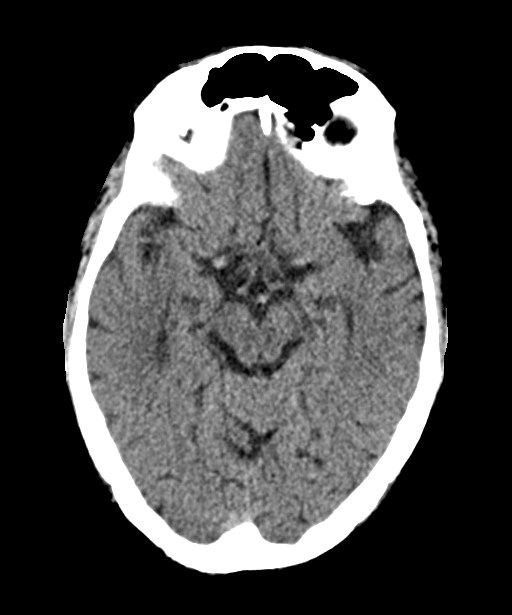
[im 14/30  brain]
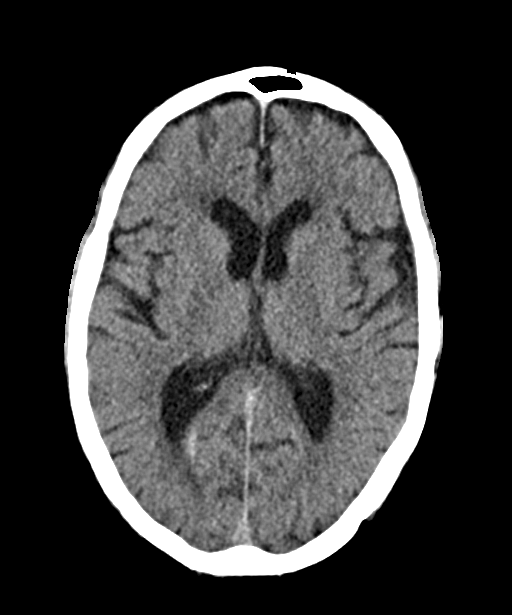
[im 17/30  brain]
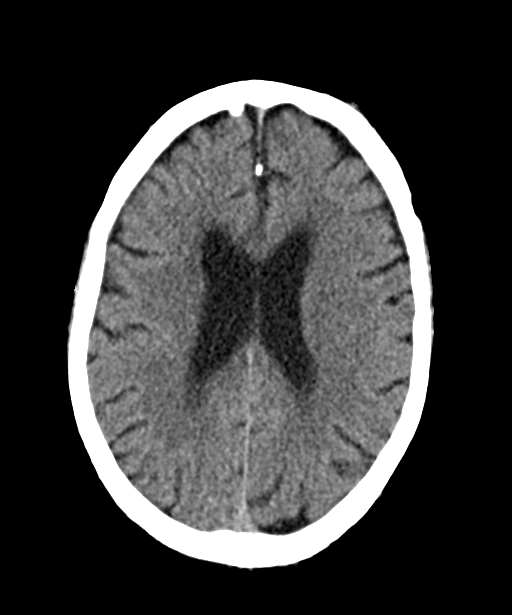
[im 17/30  bone]
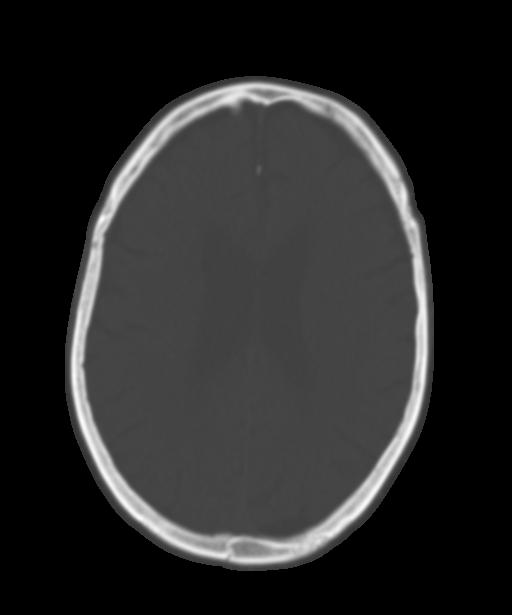
[im 21/30  brain]
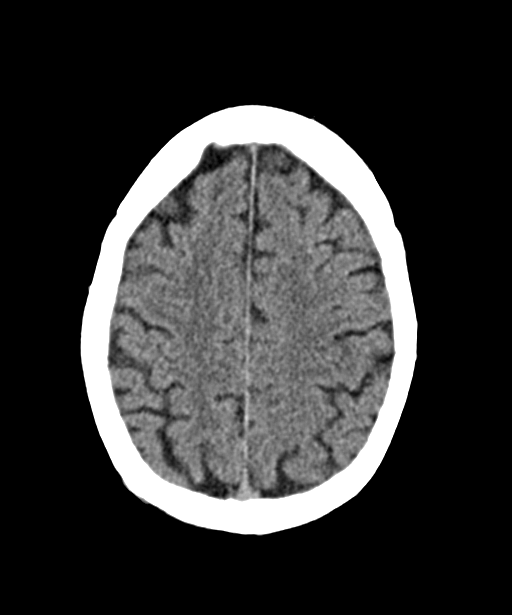
[im 24/30  brain]
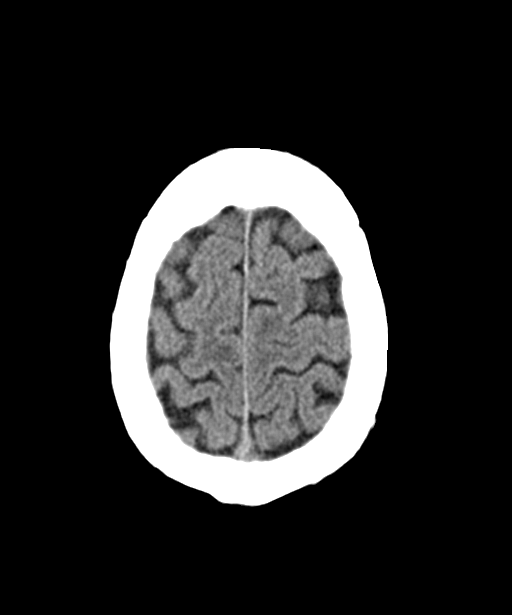
[im 28/30  brain]
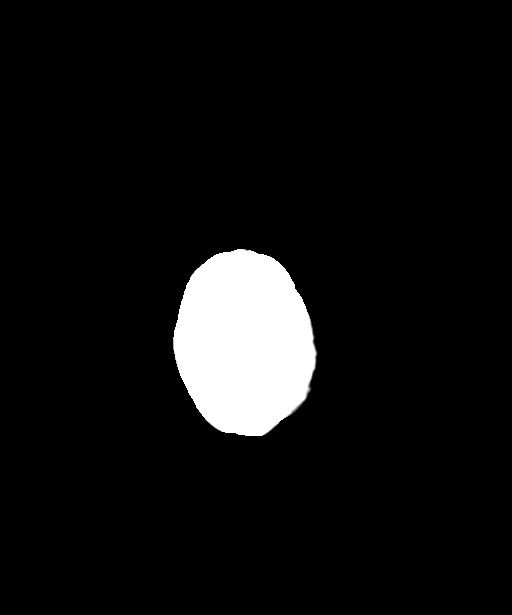

[Series 4: coronal soft · coronal · 0.35mm/px · 3 of 78 slices shown]
[im 26/78  brain]
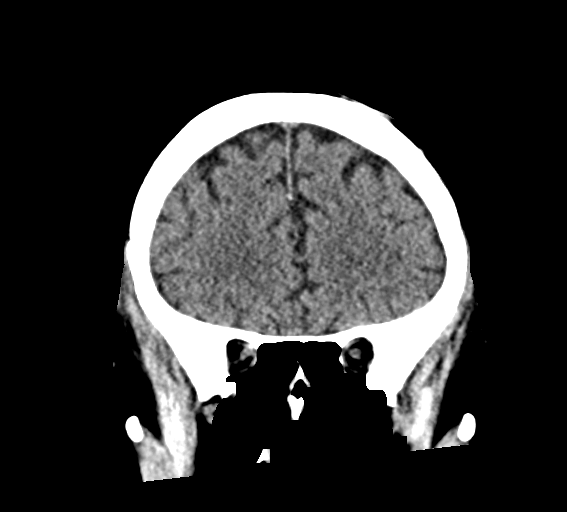
[im 35/78  brain]
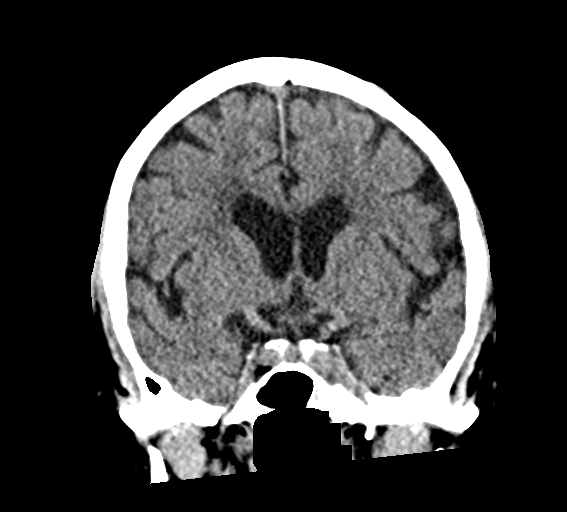
[im 43/78  brain]
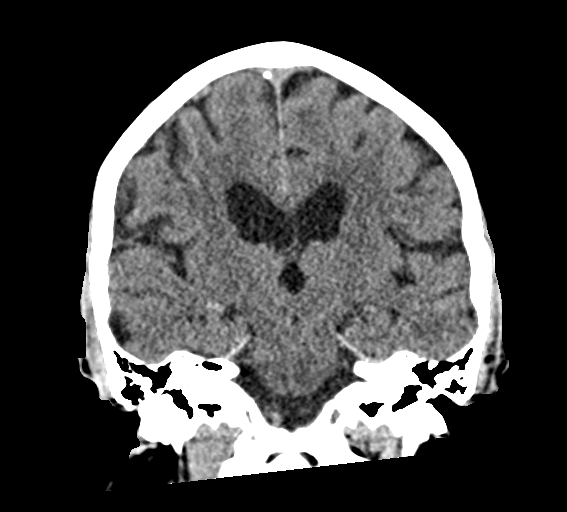

[Series 5: sag soft · sagittal · 0.35mm/px · 3 of 59 slices shown]
[im 20/59  brain]
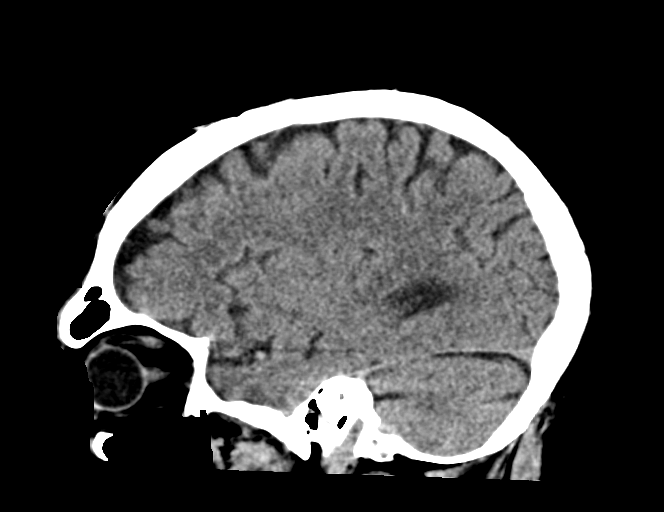
[im 30/59  brain]
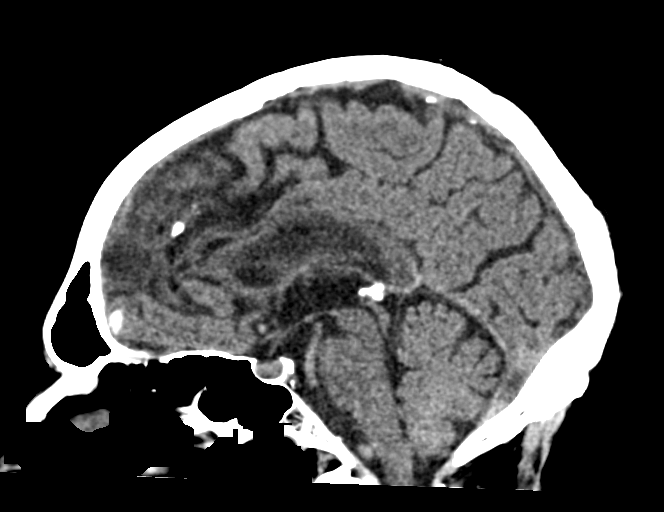
[im 39/59  brain]
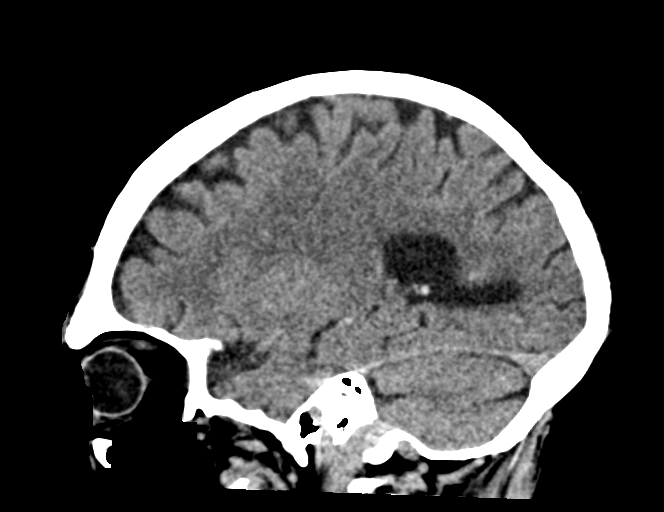

[14 of 47 positions shown; findings below may reference images not displayed]

FINDINGS: Brain: There is mild cerebral atrophy with widening of the
extra-axial spaces and ventricular dilatation.
There are areas of decreased attenuation within the white matter
tracts of the supratentorial brain, consistent with microvascular
disease changes.

Vascular: No hyperdense vessel or unexpected calcification.

Skull: Normal. Negative for fracture or focal lesion.

Sinuses/Orbits: No acute finding.

Other: None.
IMPRESSION: 1. Generalized cerebral atrophy.
2. No acute intracranial abnormality.
